# Patient Record
Sex: Female | Born: 1985 | Race: Black or African American | Hispanic: No | Marital: Single | Smoking: Never smoker
Health system: Southern US, Community
[De-identification: ages and names within clinical notes are randomized; demographics above are authoritative.]

## PROBLEM LIST (undated history)

## (undated) DIAGNOSIS — N6009 Solitary cyst of unspecified breast: Secondary | ICD-10-CM

## (undated) DIAGNOSIS — O1213 Gestational proteinuria, third trimester: Secondary | ICD-10-CM

## (undated) HISTORY — PX: CYST EXCISION: SHX5701

## (undated) HISTORY — PX: BREAST CYST EXCISION: SHX579

---
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
## 2007-12-11 ENCOUNTER — Encounter: Admission: RE | Admit: 2007-12-11 | Discharge: 2007-12-11 | Payer: Self-pay | Admitting: Obstetrics and Gynecology

## 2012-07-07 ENCOUNTER — Other Ambulatory Visit: Payer: Self-pay | Admitting: Internal Medicine

## 2012-07-07 DIAGNOSIS — N6019 Diffuse cystic mastopathy of unspecified breast: Secondary | ICD-10-CM

## 2012-11-14 ENCOUNTER — Encounter (HOSPITAL_COMMUNITY): Payer: Self-pay | Admitting: Emergency Medicine

## 2012-11-14 ENCOUNTER — Emergency Department (HOSPITAL_COMMUNITY)
Admission: EM | Admit: 2012-11-14 | Discharge: 2012-11-14 | Disposition: A | Discharge status: Home / Self Care | Payer: BC Managed Care – PPO | Source: Home / Self Care

## 2012-11-14 DIAGNOSIS — L259 Unspecified contact dermatitis, unspecified cause: Secondary | ICD-10-CM

## 2012-11-14 MED ORDER — FLUTICASONE PROPIONATE 0.05 % EX CREA: TOPICAL_CREAM | Freq: Two times a day (BID) | CUTANEOUS | Status: DC

## 2012-11-14 NOTE — ED Provider Notes (Signed)
CSN: [id number]     Arrival date & time 11/14/12  1212 History   None    Chief Complaint  Patient presents with  . Oral Swelling   (Consider location/radiation/quality/duration/timing/severity/associated sxs/prior Treatment) Patient is a 27 y.o. female presenting with rash. The history is provided by the patient.  Rash Pain severity:  No pain Onset quality:  Gradual Duration:  1 day Timing:  Constant Progression:  Unchanged Chronicity:  New Ineffective treatments: benadryl ineffective.   History reviewed. No pertinent past medical history. Past Surgical History  Procedure Laterality Date  . Cyst excision Right     breast   No family history on file. History  Substance Use Topics  . Smoking status: Never Smoker   . Smokeless tobacco: Not on file  . Alcohol Use: Yes   OB History   Grav Para Term Preterm Abortions TAB SAB Ect Mult Living                 Review of Systems  Constitutional: Negative.   Skin: Positive for rash.    Allergies  Peanuts; Penicillins; and Sulfa antibiotics  Home Medications   Current Outpatient Rx  Name  Route  Sig  Dispense  Refill  . diphenhydrAMINE (BENADRYL) 25 mg capsule   Oral   Take 25 mg by mouth every 6 (six) hours as needed for itching.         . fluticasone (CUTIVATE) 0.05 % cream   Topical   Apply topically 2 (two) times daily.   30 g   0    BP 125/57  Pulse 73  Temp(Src) 98.7 F (37.1 C) (Oral)  Resp 16  SpO2 100%  LMP 10/17/2012 Physical Exam  Nursing note and vitals reviewed. Constitutional: She is oriented to person, place, and time. She appears well-developed and well-nourished.  HENT:  Mouth/Throat: Oropharynx is clear and moist.  Cardiovascular: Regular rhythm.   Pulmonary/Chest: Breath sounds normal.  Lymphadenopathy:    She has no cervical adenopathy.  Neurological: She is alert and oriented to person, place, and time.  Skin: Skin is warm and dry. Rash noted.  Upper and lower lip sts and itching  dermatitis.    ED Course  Procedures (including critical care time) Labs Review Labs Reviewed - No data to display Imaging Review No results found.  MDM   1. Acute contact dermatitis       Linna Hoff, MD 11/14/12 [phone]

## 2012-11-14 NOTE — ED Notes (Signed)
Patient reports upper lip swelling that started last night, intermittent.  Reports swelling comes and goes.  Has had benadryl this am.  No tongue swelling, no breathing difficulty

## 2013-07-06 ENCOUNTER — Other Ambulatory Visit: Payer: Self-pay | Admitting: Family Medicine

## 2013-07-06 DIAGNOSIS — N63 Unspecified lump in unspecified breast: Secondary | ICD-10-CM

## 2013-07-07 ENCOUNTER — Other Ambulatory Visit: Payer: Self-pay | Admitting: Internal Medicine

## 2013-07-13 ENCOUNTER — Other Ambulatory Visit: Payer: BC Managed Care – PPO

## 2013-07-13 ENCOUNTER — Inpatient Hospital Stay: Admission: RE | Admit: 2013-07-13 | Payer: BC Managed Care – PPO | Source: Ambulatory Visit

## 2014-01-12 LAB — OB RESULTS CONSOLE HGB/HCT, BLOOD
HEMATOCRIT: 34 %
Hemoglobin: 11.5 g/dL

## 2014-01-12 LAB — OB RESULTS CONSOLE HIV ANTIBODY (ROUTINE TESTING): HIV: NONREACTIVE

## 2014-01-12 LAB — OB RESULTS CONSOLE ABO/RH: ABO/RH(D): AB POS

## 2014-01-12 LAB — OB RESULTS CONSOLE PLATELET COUNT: Platelets: 342 10*3/uL

## 2014-01-12 LAB — OB RESULTS CONSOLE RUBELLA ANTIBODY, IGM: RUBELLA: IMMUNE

## 2014-01-12 LAB — OB RESULTS CONSOLE ANTIBODY SCREEN: Antibody Screen: NEGATIVE

## 2014-01-12 LAB — OB RESULTS CONSOLE HEPATITIS B SURFACE ANTIGEN: Hepatitis B Surface Ag: NEGATIVE

## 2014-01-12 LAB — SICKLE CELL SCREEN: Sickle Cell Screen: NEGATIVE

## 2014-01-12 LAB — OB RESULTS CONSOLE GBS: GBS: POSITIVE

## 2014-01-12 LAB — OB RESULTS CONSOLE RPR: RPR: NONREACTIVE

## 2014-01-19 LAB — OB RESULTS CONSOLE GC/CHLAMYDIA
Chlamydia: NEGATIVE
GC PROBE AMP, GENITAL: NEGATIVE

## 2014-05-19 LAB — OB RESULTS CONSOLE HGB/HCT, BLOOD
HCT: 34 %
HEMOGLOBIN: 11.5 g/dL

## 2014-05-19 LAB — OB RESULTS CONSOLE RPR: RPR: NONREACTIVE

## 2014-05-19 LAB — OB RESULTS CONSOLE PLATELET COUNT: Platelets: 342 10*3/uL

## 2014-07-21 ENCOUNTER — Inpatient Hospital Stay (HOSPITAL_COMMUNITY)
Admission: AD | Admit: 2014-07-21 | Discharge: 2014-07-25 | DRG: 765 | Disposition: A | Discharge status: Home / Self Care | Payer: BC Managed Care – PPO | Source: Ambulatory Visit | Attending: Obstetrics and Gynecology | Admitting: Obstetrics and Gynecology

## 2014-07-21 ENCOUNTER — Encounter (HOSPITAL_COMMUNITY): Payer: Self-pay

## 2014-07-21 DIAGNOSIS — D62 Acute posthemorrhagic anemia: Secondary | ICD-10-CM | POA: Diagnosis not present

## 2014-07-21 DIAGNOSIS — O1490 Unspecified pre-eclampsia, unspecified trimester: Secondary | ICD-10-CM | POA: Diagnosis present

## 2014-07-21 DIAGNOSIS — T368X5A Adverse effect of other systemic antibiotics, initial encounter: Secondary | ICD-10-CM | POA: Diagnosis not present

## 2014-07-21 DIAGNOSIS — Z98891 History of uterine scar from previous surgery: Secondary | ICD-10-CM

## 2014-07-21 DIAGNOSIS — Z3A38 38 weeks gestation of pregnancy: Secondary | ICD-10-CM | POA: Diagnosis present

## 2014-07-21 DIAGNOSIS — Y848 Other medical procedures as the cause of abnormal reaction of the patient, or of later complication, without mention of misadventure at the time of the procedure: Secondary | ICD-10-CM | POA: Diagnosis not present

## 2014-07-21 DIAGNOSIS — O99824 Streptococcus B carrier state complicating childbirth: Secondary | ICD-10-CM | POA: Diagnosis present

## 2014-07-21 DIAGNOSIS — O1213 Gestational proteinuria, third trimester: Secondary | ICD-10-CM

## 2014-07-21 DIAGNOSIS — O1493 Unspecified pre-eclampsia, third trimester: Secondary | ICD-10-CM

## 2014-07-21 DIAGNOSIS — T783XXA Angioneurotic edema, initial encounter: Secondary | ICD-10-CM | POA: Diagnosis not present

## 2014-07-21 DIAGNOSIS — O9081 Anemia of the puerperium: Secondary | ICD-10-CM | POA: Diagnosis not present

## 2014-07-21 DIAGNOSIS — Y92239 Unspecified place in hospital as the place of occurrence of the external cause: Secondary | ICD-10-CM

## 2014-07-21 HISTORY — DX: Gestational proteinuria, third trimester: O12.13

## 2014-07-21 HISTORY — DX: Solitary cyst of unspecified breast: N60.09

## 2014-07-21 LAB — CBC
HCT: 32.9 % — ABNORMAL LOW (ref 36.0–46.0)
HEMATOCRIT: 30.6 % — AB (ref 36.0–46.0)
Hemoglobin: 10.7 g/dL — ABNORMAL LOW (ref 12.0–15.0)
Hemoglobin: 11.4 g/dL — ABNORMAL LOW (ref 12.0–15.0)
MCH: 30.7 pg (ref 26.0–34.0)
MCH: 30.9 pg (ref 26.0–34.0)
MCHC: 34.7 g/dL (ref 30.0–36.0)
MCHC: 35 g/dL (ref 30.0–36.0)
MCV: 88.4 fL (ref 78.0–100.0)
MCV: 88.7 fL (ref 78.0–100.0)
PLATELETS: 303 10*3/uL (ref 150–400)
PLATELETS: 269 10*3/uL (ref 150–400)
RBC: 3.46 MIL/uL — ABNORMAL LOW (ref 3.87–5.11)
RBC: 3.71 MIL/uL — AB (ref 3.87–5.11)
RDW: 13.4 % (ref 11.5–15.5)
RDW: 13.6 % (ref 11.5–15.5)
WBC: 6.6 10*3/uL (ref 4.0–10.5)
WBC: 6.7 10*3/uL (ref 4.0–10.5)

## 2014-07-21 LAB — COMPREHENSIVE METABOLIC PANEL
ALBUMIN: 2.5 g/dL — AB (ref 3.5–5.0)
ALK PHOS: 160 U/L — AB (ref 38–126)
ALT: 69 U/L — ABNORMAL HIGH (ref 14–54)
AST: 42 U/L — AB (ref 15–41)
Anion gap: 7 (ref 5–15)
BILIRUBIN TOTAL: 0.2 mg/dL — AB (ref 0.3–1.2)
BUN: 10 mg/dL (ref 6–20)
CHLORIDE: 108 mmol/L (ref 101–111)
CO2: 21 mmol/L — ABNORMAL LOW (ref 22–32)
Calcium: 9.3 mg/dL (ref 8.9–10.3)
Creatinine, Ser: 0.81 mg/dL (ref 0.44–1.00)
GFR calc Af Amer: >60 mL/min (ref >60)
GFR calc non Af Amer: >60 mL/min (ref >60)
Glucose, Bld: 92 mg/dL (ref 70–99)
POTASSIUM: 3.8 mmol/L (ref 3.5–5.1)
SODIUM: 136 mmol/L (ref 135–145)
TOTAL PROTEIN: 7 g/dL (ref 6.5–8.1)

## 2014-07-21 LAB — PROTEIN / CREATININE RATIO, URINE
Creatinine, Urine: 371 mg/dL
Protein Creatinine Ratio: 0.37 mg/mg{Cre} — ABNORMAL HIGH (ref 0.00–0.15)
Total Protein, Urine: 136 mg/dL

## 2014-07-21 LAB — TYPE AND SCREEN
ABO/RH(D): AB POS
Antibody Screen: NEGATIVE

## 2014-07-21 LAB — ABO/RH: ABO/RH(D): AB POS

## 2014-07-21 LAB — URIC ACID: URIC ACID, SERUM: 5.9 mg/dL (ref 2.3–6.6)

## 2014-07-21 MED ORDER — MAGNESIUM SULFATE 50 % IJ SOLN
2.0000 g/h | INTRAVENOUS | Status: AC
  Administered 2014-07-22 – 2014-07-23 (×2): 2 g/h via INTRAVENOUS
  Filled 2014-07-21 (×3): qty 80

## 2014-07-21 MED ORDER — BUTORPHANOL TARTRATE 1 MG/ML IJ SOLN: 1.0000 mg | INTRAMUSCULAR | Status: DC | PRN

## 2014-07-21 MED ORDER — ACETAMINOPHEN 325 MG PO TABS: 650.0000 mg | ORAL_TABLET | ORAL | Status: DC | PRN

## 2014-07-21 MED ORDER — OXYCODONE-ACETAMINOPHEN 5-325 MG PO TABS: 1.0000 | ORAL_TABLET | ORAL | Status: DC | PRN

## 2014-07-21 MED ORDER — OXYCODONE-ACETAMINOPHEN 5-325 MG PO TABS: 2.0000 | ORAL_TABLET | ORAL | Status: DC | PRN

## 2014-07-21 MED ORDER — MAGNESIUM SULFATE BOLUS VIA INFUSION
4.0000 g | Freq: Once | INTRAVENOUS | Status: AC
  Administered 2014-07-21: 4 g via INTRAVENOUS
  Filled 2014-07-21: qty 500

## 2014-07-21 MED ORDER — OXYTOCIN 40 UNITS IN LACTATED RINGERS INFUSION - SIMPLE MED
1.0000 m[IU]/min | INTRAVENOUS | Status: DC
  Administered 2014-07-21: 2 m[IU]/min via INTRAVENOUS
  Administered 2014-07-21: 10 m[IU]/min via INTRAVENOUS
  Administered 2014-07-22: 16 m[IU]/min via INTRAVENOUS
  Administered 2014-07-22: 20 m[IU]/min via INTRAVENOUS
  Filled 2014-07-21: qty 1000

## 2014-07-21 MED ORDER — OXYTOCIN BOLUS FROM INFUSION
500.0000 mL | INTRAVENOUS | Status: DC
Start: 2014-07-21 — End: 2014-07-22

## 2014-07-21 MED ORDER — DIPHENHYDRAMINE HCL 50 MG/ML IJ SOLN
50.0000 mg | Freq: Once | INTRAMUSCULAR | Status: AC
  Administered 2014-07-21: 50 mg via INTRAVENOUS

## 2014-07-21 MED ORDER — CLINDAMYCIN PHOSPHATE 900 MG/50ML IV SOLN
900.0000 mg | Freq: Three times a day (TID) | INTRAVENOUS | Status: DC
  Administered 2014-07-21: 900 mg via INTRAVENOUS
  Filled 2014-07-21: qty 50

## 2014-07-21 MED ORDER — LIDOCAINE HCL (PF) 1 % IJ SOLN: 30.0000 mL | INTRAMUSCULAR | Status: DC | PRN

## 2014-07-21 MED ORDER — VANCOMYCIN HCL IN DEXTROSE 1-5 GM/200ML-% IV SOLN
1000.0000 mg | Freq: Two times a day (BID) | INTRAVENOUS | Status: DC
  Administered 2014-07-22 (×2): 1000 mg via INTRAVENOUS
  Filled 2014-07-21 (×4): qty 200

## 2014-07-21 MED ORDER — LACTATED RINGERS IV SOLN
INTRAVENOUS | Status: DC
  Administered 2014-07-21 (×2): via INTRAVENOUS
  Administered 2014-07-22: 82 mL/h via INTRAVENOUS
  Administered 2014-07-22: via INTRAVENOUS

## 2014-07-21 MED ORDER — CITRIC ACID-SODIUM CITRATE 334-500 MG/5ML PO SOLN
30.0000 mL | ORAL | Status: DC | PRN
  Administered 2014-07-22: 30 mL via ORAL
  Filled 2014-07-21: qty 15

## 2014-07-21 MED ORDER — LACTATED RINGERS IV SOLN
500.0000 mL | INTRAVENOUS | Status: DC | PRN
  Administered 2014-07-22: 250 mL via INTRAVENOUS

## 2014-07-21 MED ORDER — TERBUTALINE SULFATE 1 MG/ML IJ SOLN: 0.2500 mg | Freq: Once | INTRAMUSCULAR | Status: AC | PRN

## 2014-07-21 MED ORDER — DIPHENHYDRAMINE HCL 50 MG/ML IJ SOLN
INTRAMUSCULAR | Status: AC
  Filled 2014-07-21: qty 1

## 2014-07-21 MED ORDER — OXYTOCIN 40 UNITS IN LACTATED RINGERS INFUSION - SIMPLE MED
62.5000 mL/h | INTRAVENOUS | Status: DC
Start: 2014-07-21 — End: 2014-07-22

## 2014-07-21 NOTE — Progress Notes (Signed)
Here as IV clindamycin and IV magnesium bolus infusion. Watched periorbital edema occurring. Benadryl given. Clindamycin stopped. Will change to IV vancomycin for continued GBS prophylaxis. Pharmacy informed. Pt notified of  New presumed allergy to clindamycin

## 2014-07-21 NOTE — H&P (Signed)
Sherri Lewis is a 29 y.o. female presenting @ 1438 weeks gestation for IOL 2nd to atypical preeclampsia. [facility] >300[facility]  protein on straight cath Maternal Medical History:  Reason for admission: Nausea.  Fetal activity: Perceived fetal activity is normal.    Prenatal complications: no prenatal complications   OB History    Gravida Para Term Preterm AB TAB SAB Ectopic Multiple Living   1         0     Past Medical History  Diagnosis Date  . Breast cyst   . Proteinuria affecting pregnancy in third trimester, antepartum 07/21/2014   Past Surgical History  Procedure Laterality Date  . Cyst excision Right     breast  . Breast cyst excision     Family History: family history includes Cancer in her mother. Social History:  reports that she has never smoked. She does not have any smokeless tobacco history on file. She reports that she does not drink alcohol or use illicit drugs.   Prenatal Transfer Tool  Maternal Diabetes: No Genetic Screening: Normal Maternal Ultrasounds/Referrals: Normal Fetal Ultrasounds or other Referrals:  None Maternal Substance Abuse:  No Significant Maternal Medications:  None Significant Maternal Lab Results:  Lab values include: Group B Strep positive Other Comments:  None  Review of Systems  Eyes: Negative for blurred vision.  Respiratory: Negative for shortness of breath.   Gastrointestinal: Negative for heartburn and nausea.  Neurological: Negative for headaches.  All other systems reviewed and are negative.     Blood pressure 138/77, pulse 67, temperature 98.1 F (36.7 C), temperature source Oral, resp. rate 18, height 5\' 7"  (1.702 m), weight 104.781 kg (231 lb). Maternal Exam:  Uterine Assessment: Contraction strength is mild.  Contraction frequency is irregular.   Abdomen: Patient reports no abdominal tenderness. Fetal presentation: vertex  Introitus: Normal vulva. Ferning test: not done.   Nitrazine test: not done.  Pelvis: adequate for delivery.   Cervix: Cervix evaluated by digital exam.     Fetal Exam Fetal Monitor Review: Variability: moderate (6-25 bpm).   Pattern: accelerations present.    Fetal State Assessment: Category I - tracings are normal.     Physical Exam  Constitutional: She is oriented to person, place, and time. She appears well-developed and well-nourished.  HENT:  Head: Atraumatic.  Eyes: EOM are normal.  Neck: Neck supple.  Cardiovascular: Regular rhythm.   Respiratory: Breath sounds normal.  GI: Soft.  Musculoskeletal: She exhibits edema.  Neurological: She is alert and oriented to person, place, and time.  Skin: Skin is warm and dry.  Psychiatric: She has a normal mood and affect.   VE 2/80/-3/-2 posterior soft  Prenatal labs: ABO, Rh: --/--/AB POSITIVE (11/03 0000) Antibody: Negative (11/03 0000) Rubella: Immune (11/03 0000) RPR: Nonreactive (03/09 0000)  HBsAg: Negative (11/03 0000)  HIV: Non-reactive (11/03 0000)  GBS: Positive (11/03 0000)  CBC    Component Value Date/Time   WBC 6.7 07/21/2014 1630   RBC 3.71* 07/21/2014 1630   HGB 11.4* 07/21/2014 1630   HGB 11.5 05/19/2014   HCT 32.9* 07/21/2014 1630   HCT 34 05/19/2014   PLT 303 07/21/2014 1630   PLT 342 05/19/2014   MCV 88.7 07/21/2014 1630   MCH 30.7 07/21/2014 1630   MCHC 34.7 07/21/2014 1630   RDW 13.4 07/21/2014 1630   CMP Latest Ref Rng 07/21/2014  Glucose 70 - 99 mg/dL 92  BUN 6 - 20 mg/dL 10  Creatinine 0.44 - 1.00 mg/dL 1.300.81  Sodium 865135 - 784145 mmol/L 136  Potassium 3.5 - 5.1 mmol/L 3.8  Chloride 101 - 111 mmol/L 108  CO2 22 - 32 mmol/L 21(L)  Calcium 8.9 - 10.3 mg/dL 9.3  Total Protein 6.5 - 8.1 g/dL 7.0  Total Bilirubin 0.3 - 1.2 mg/dL 6.9(G0.2(L)  Alkaline Phos 38 - 126 U/L 160(H)  AST 15 - 41 U/L 42(H)  ALT 14 - 54 U/L 69(H)     Assessment/Plan: Atypical preeclampsia IUP@ 38 weeks GBS cx (+)  P) admit. Routine labs.repeat PIH labs 6 hrs.  Start pitocin. Clindamycin. Epidural prn. magnesium   Zayden Hahne A 07/21/2014, 6:40 PM

## 2014-07-21 NOTE — Progress Notes (Signed)
Noted pt eyes swelling. Pt reports throat itching and coughing.  Clindamycin almost complete, but discontinued at this time.

## 2014-07-21 NOTE — MAU Provider Note (Signed)
History     CSN: [id number]  Arrival date and time: 07/21/14 1558  Provider notified: 1612 Orders placed in EPIC: 1615 Provider at bedside: 1630     Chief Complaint  Patient presents with  . Foot Swelling   HPI  [name] is 29 yo G1P0 female at 38.[redacted] wks gestation sent from the office with proteinuria (>300 mg on cath urine).  BP WNL in the office. 1+ edema in BLE. Prenatal care complicated by: (+) GBS, S>D and fetal macrosomia. Her primary OB provider is Dr. Cherly Hensenousins at [facility].  Past Medical History  Diagnosis Date  . Breast cyst   . Proteinuria affecting pregnancy in third trimester, antepartum 07/21/2014    Past Surgical History  Procedure Laterality Date  . Cyst excision Right     breast  . Breast cyst excision      Family History  Problem Relation Age of Onset  . Cancer Mother     History  Substance Use Topics  . Smoking status: Never Smoker   . Smokeless tobacco: Not on file  . Alcohol Use: No    Allergies:  Allergies  Allergen Reactions  . Penicillins Anaphylaxis  . Sulfa Antibiotics Anaphylaxis  . Peanuts [Peanut Oil] Swelling    Nut allergy    Prescriptions prior to admission  Medication Sig Dispense Refill Last Dose  . calcium carbonate (TUMS - DOSED IN MG ELEMENTAL CALCIUM) 500 MG chewable tablet Chew 2 tablets by mouth as needed for indigestion or heartburn. Patient take 4-5 times daily.   07/20/2014 at Unknown time  . Prenatal Vit-Fe Fumarate-FA (PRENATAL MULTIVITAMIN) TABS tablet Take 1 tablet by mouth daily at 12 noon.   Past Month at Unknown time  . fluticasone (CUTIVATE) 0.05 % cream Apply topically 2 (two) times daily. (Patient not taking: Reported on 07/21/2014) 30 g 0 Not Taking at Unknown time    Review of Systems  Constitutional: Negative.   HENT: Negative.   Eyes: Negative.   Respiratory: Negative.   Cardiovascular: Positive for leg swelling.       Mild swelling in hands and feet only  Gastrointestinal: Negative.    Genitourinary: Negative.   Musculoskeletal: Negative.   Skin: Negative.   Neurological: Negative.   Endo/Heme/Allergies: Negative.   Psychiatric/Behavioral: Negative.    CEFM FHR: 130 bpm / moderate variability / accels present / no decels TOCO: 2 UC's noted  VE: 1-2 cm / 80% / -2 (done in the office prior to arrival at hospital)  Physical Exam   Blood pressure 136/88, P: 79; 130/90, 68; 129/77, 63; 133/84, 76; 137/119, 72; 144/81; 140/80, 66; 146/128, 79. R: 20; T: not taken  Physical Exam  Constitutional: She is oriented to person, place, and time. She appears well-developed and well-nourished.  HENT:  Head: Normocephalic and atraumatic.  Eyes: Pupils are equal, round, and reactive to light.  Neck: Normal range of motion.  Cardiovascular: Normal rate, regular rhythm, normal heart sounds and intact distal pulses.   1+ non-pitting edema in bilateral feet  Respiratory: Effort normal and breath sounds normal.  GI: Soft.  Hypoactive BS; not eaten since breakfast  Genitourinary:  Pelvic deferred; gravid; S>D  Musculoskeletal: Normal range of motion.  Neurological: She is alert and oriented to person, place, and time. She has normal reflexes.  Skin: Skin is warm and dry.  Psychiatric: She has a normal mood and affect. Her behavior is normal. Judgment and thought content normal.    MAU Course  Procedures CBC CMP Uric Acid  Urine Protein/Creatinine Ratio  CEFM Serial BPs Assessment and Plan  29 yo G1P0 at [redacted] wks gestation Pre-Eclampsia Proteinuria Fetal macorsomia (+) GBS - PCN allergic - no sensitivities done  Admit to L&D for IOL Magnesium Sulfate 4 gram loading dose, then 2 grams per hour See Admission orders / H&P note from Dr. Cherly Hensenousins  *Care assumed by Dr. Cherly Hensenousins at time of admission  Sherri Lewis, ROLITTA, M MSN, CNM 07/21/2014, 4:30 PM

## 2014-07-21 NOTE — MAU Note (Signed)
Patient home MD visit today had protein in her urine [facility]

## 2014-07-22 ENCOUNTER — Inpatient Hospital Stay (HOSPITAL_COMMUNITY): Payer: BC Managed Care – PPO | Admitting: Anesthesiology

## 2014-07-22 ENCOUNTER — Inpatient Hospital Stay (HOSPITAL_COMMUNITY): Payer: BC Managed Care – PPO

## 2014-07-22 ENCOUNTER — Encounter (HOSPITAL_COMMUNITY)
Admission: AD | Disposition: A | Discharge status: Home / Self Care | Payer: Self-pay | Source: Ambulatory Visit | Attending: Obstetrics and Gynecology

## 2014-07-22 ENCOUNTER — Encounter (HOSPITAL_COMMUNITY): Payer: Self-pay | Admitting: Radiology

## 2014-07-22 LAB — COMPREHENSIVE METABOLIC PANEL
ALBUMIN: 2.4 g/dL — AB (ref 3.5–5.0)
ALK PHOS: 171 U/L — AB (ref 38–126)
ALK PHOS: 151 U/L — AB (ref 38–126)
ALT: 62 U/L — ABNORMAL HIGH (ref 14–54)
ALT: 64 U/L — ABNORMAL HIGH (ref 14–54)
ALT: 69 U/L — ABNORMAL HIGH (ref 14–54)
ANION GAP: 7 (ref 5–15)
ANION GAP: 7 (ref 5–15)
AST: 39 U/L (ref 15–41)
AST: 40 U/L (ref 15–41)
AST: 41 U/L (ref 15–41)
Albumin: 2.7 g/dL — ABNORMAL LOW (ref 3.5–5.0)
Albumin: 2.3 g/dL — ABNORMAL LOW (ref 3.5–5.0)
Alkaline Phosphatase: 155 U/L — ABNORMAL HIGH (ref 38–126)
Anion gap: 9 (ref 5–15)
BUN: 7 mg/dL (ref 6–20)
BUN: 6 mg/dL (ref 6–20)
BUN: 9 mg/dL (ref 6–20)
CHLORIDE: 108 mmol/L (ref 101–111)
CHLORIDE: 109 mmol/L (ref 101–111)
CO2: 19 mmol/L — ABNORMAL LOW (ref 22–32)
CO2: 21 mmol/L — ABNORMAL LOW (ref 22–32)
CO2: 21 mmol/L — ABNORMAL LOW (ref 22–32)
CREATININE: 0.78 mg/dL (ref 0.44–1.00)
Calcium: 7.8 mg/dL — ABNORMAL LOW (ref 8.9–10.3)
Calcium: 8.1 mg/dL — ABNORMAL LOW (ref 8.9–10.3)
Calcium: 8.2 mg/dL — ABNORMAL LOW (ref 8.9–10.3)
Chloride: 105 mmol/L (ref 101–111)
Creatinine, Ser: 0.77 mg/dL (ref 0.44–1.00)
Creatinine, Ser: 0.78 mg/dL (ref 0.44–1.00)
GFR calc Af Amer: >60 mL/min (ref >60)
GFR calc Af Amer: >60 mL/min (ref >60)
GFR calc non Af Amer: >60 mL/min (ref >60)
GFR calc non Af Amer: >60 mL/min (ref >60)
GLUCOSE: 111 mg/dL — AB (ref 70–99)
Glucose, Bld: 101 mg/dL — ABNORMAL HIGH (ref 65–99)
Glucose, Bld: 108 mg/dL — ABNORMAL HIGH (ref 65–99)
POTASSIUM: 3.7 mmol/L (ref 3.5–5.1)
Potassium: 3.4 mmol/L — ABNORMAL LOW (ref 3.5–5.1)
Potassium: 3.7 mmol/L (ref 3.5–5.1)
Sodium: 135 mmol/L (ref 135–145)
Sodium: 135 mmol/L (ref 135–145)
Sodium: 136 mmol/L (ref 135–145)
TOTAL PROTEIN: 7.3 g/dL (ref 6.5–8.1)
Total Bilirubin: 0.2 mg/dL — ABNORMAL LOW (ref 0.3–1.2)
Total Bilirubin: 0.3 mg/dL (ref 0.3–1.2)
Total Bilirubin: 0.4 mg/dL (ref 0.3–1.2)
Total Protein: 6.4 g/dL — ABNORMAL LOW (ref 6.5–8.1)
Total Protein: 6.6 g/dL (ref 6.5–8.1)

## 2014-07-22 LAB — CBC
HCT: 32.6 % — ABNORMAL LOW (ref 36.0–46.0)
HEMATOCRIT: 33.9 % — AB (ref 36.0–46.0)
HEMATOCRIT: 34.5 % — AB (ref 36.0–46.0)
HEMOGLOBIN: 11.7 g/dL — AB (ref 12.0–15.0)
HEMOGLOBIN: 11.9 g/dL — AB (ref 12.0–15.0)
Hemoglobin: 11.1 g/dL — ABNORMAL LOW (ref 12.0–15.0)
MCH: 31 pg (ref 26.0–34.0)
MCH: 30.5 pg (ref 26.0–34.0)
MCH: 30.5 pg (ref 26.0–34.0)
MCHC: 34.5 g/dL (ref 30.0–36.0)
MCHC: 34 g/dL (ref 30.0–36.0)
MCHC: 34.5 g/dL (ref 30.0–36.0)
MCV: 89.7 fL (ref 78.0–100.0)
MCV: 89.6 fL (ref 78.0–100.0)
MCV: 88.5 fL (ref 78.0–100.0)
Platelets: 330 10*3/uL (ref 150–400)
Platelets: 288 10*3/uL (ref 150–400)
Platelets: 290 10*3/uL (ref 150–400)
RBC: 3.78 MIL/uL — AB (ref 3.87–5.11)
RBC: 3.64 MIL/uL — ABNORMAL LOW (ref 3.87–5.11)
RBC: 3.9 MIL/uL (ref 3.87–5.11)
RDW: 13.5 % (ref 11.5–15.5)
RDW: 13.6 % (ref 11.5–15.5)
RDW: 13.5 % (ref 11.5–15.5)
WBC: 9 10*3/uL (ref 4.0–10.5)
WBC: 10 10*3/uL (ref 4.0–10.5)
WBC: 15.8 10*3/uL — ABNORMAL HIGH (ref 4.0–10.5)

## 2014-07-22 LAB — MAGNESIUM
Magnesium: 3.8 mg/dL — ABNORMAL HIGH (ref 1.7–2.4)
Magnesium: 4.5 mg/dL — ABNORMAL HIGH (ref 1.7–2.4)

## 2014-07-22 LAB — URIC ACID
URIC ACID, SERUM: 5.5 mg/dL (ref 2.3–6.6)
Uric Acid, Serum: 5.6 mg/dL (ref 2.3–6.6)

## 2014-07-22 LAB — RPR: RPR Ser Ql: NONREACTIVE

## 2014-07-22 SURGERY — Surgical Case
Anesthesia: Epidural

## 2014-07-22 MED ORDER — ONDANSETRON HCL 4 MG/2ML IJ SOLN
INTRAMUSCULAR | Status: DC | PRN
  Administered 2014-07-22: 4 mg via INTRAVENOUS

## 2014-07-22 MED ORDER — LIDOCAINE HCL (PF) 1 % IJ SOLN
INTRAMUSCULAR | Status: DC | PRN
Start: 2014-07-22 — End: 2014-07-22
  Administered 2014-07-22 (×2): 4 mL

## 2014-07-22 MED ORDER — PHENYLEPHRINE 40 MCG/ML (10ML) SYRINGE FOR IV PUSH (FOR BLOOD PRESSURE SUPPORT)
80.0000 ug | PREFILLED_SYRINGE | INTRAVENOUS | Status: DC | PRN
  Filled 2014-07-22: qty 20

## 2014-07-22 MED ORDER — LACTATED RINGERS IV SOLN
INTRAVENOUS | Status: DC | PRN
  Administered 2014-07-22: 21:00:00 via INTRAVENOUS

## 2014-07-22 MED ORDER — MAGNESIUM SULFATE BOLUS VIA INFUSION
2.0000 g | Freq: Once | INTRAVENOUS | Status: AC
  Administered 2014-07-22: 2 g via INTRAVENOUS

## 2014-07-22 MED ORDER — METRONIDAZOLE IN NACL 5-0.79 MG/ML-% IV SOLN
500.0000 mg | Freq: Once | INTRAVENOUS | Status: AC
  Administered 2014-07-22: 500 mg via INTRAVENOUS
  Filled 2014-07-22: qty 100

## 2014-07-22 MED ORDER — SCOPOLAMINE 1 MG/3DAYS TD PT72
MEDICATED_PATCH | TRANSDERMAL | Status: AC
  Filled 2014-07-22: qty 1

## 2014-07-22 MED ORDER — ONDANSETRON HCL 4 MG/2ML IJ SOLN
INTRAMUSCULAR | Status: AC
  Filled 2014-07-22: qty 2

## 2014-07-22 MED ORDER — EPHEDRINE 5 MG/ML INJ: 10.0000 mg | INTRAVENOUS | Status: DC | PRN

## 2014-07-22 MED ORDER — LIDOCAINE-EPINEPHRINE (PF) 2 %-1:200000 IJ SOLN
INTRAMUSCULAR | Status: AC
  Filled 2014-07-22: qty 20

## 2014-07-22 MED ORDER — GENTAMICIN SULFATE 40 MG/ML IJ SOLN
5.0000 mg/kg | Freq: Once | INTRAVENOUS | Status: AC
  Administered 2014-07-22: 390 mg via INTRAVENOUS
  Filled 2014-07-22: qty 9.75

## 2014-07-22 MED ORDER — MORPHINE SULFATE (PF) 0.5 MG/ML IJ SOLN
INTRAMUSCULAR | Status: DC | PRN
  Administered 2014-07-22: 4 mg via EPIDURAL

## 2014-07-22 MED ORDER — HYDROMORPHONE HCL 1 MG/ML IJ SOLN: 0.2500 mg | INTRAMUSCULAR | Status: DC | PRN

## 2014-07-22 MED ORDER — DIPHENHYDRAMINE HCL 50 MG/ML IJ SOLN
INTRAMUSCULAR | Status: AC
  Filled 2014-07-22: qty 1

## 2014-07-22 MED ORDER — PHENYLEPHRINE 40 MCG/ML (10ML) SYRINGE FOR IV PUSH (FOR BLOOD PRESSURE SUPPORT)
PREFILLED_SYRINGE | INTRAVENOUS | Status: AC
  Filled 2014-07-22: qty 10

## 2014-07-22 MED ORDER — SODIUM BICARBONATE 8.4 % IV SOLN
INTRAVENOUS | Status: AC
  Filled 2014-07-22: qty 50

## 2014-07-22 MED ORDER — FENTANYL 2.5 MCG/ML BUPIVACAINE 1/10 % EPIDURAL INFUSION (WH - ANES)
14.0000 mL/h | INTRAMUSCULAR | Status: DC | PRN
  Administered 2014-07-22 (×3): 14 mL/h via EPIDURAL
  Filled 2014-07-22 (×2): qty 125

## 2014-07-22 MED ORDER — MEPERIDINE HCL 25 MG/ML IJ SOLN
6.2500 mg | INTRAMUSCULAR | Status: DC | PRN
Start: 2014-07-22 — End: 2014-07-22

## 2014-07-22 MED ORDER — PHENYLEPHRINE HCL 10 MG/ML IJ SOLN
INTRAMUSCULAR | Status: DC | PRN
  Administered 2014-07-22 (×2): 80 ug via INTRAVENOUS

## 2014-07-22 MED ORDER — METOCLOPRAMIDE HCL 5 MG/ML IJ SOLN
INTRAMUSCULAR | Status: DC | PRN
  Administered 2014-07-22: 10 mg via INTRAVENOUS

## 2014-07-22 MED ORDER — OXYTOCIN 40 UNITS IN LACTATED RINGERS INFUSION - SIMPLE MED: 1.0000 m[IU]/min | INTRAVENOUS | Status: DC

## 2014-07-22 MED ORDER — FENTANYL CITRATE (PF) 250 MCG/5ML IJ SOLN
INTRAMUSCULAR | Status: DC | PRN
  Administered 2014-07-22: 100 ug via INTRAVENOUS

## 2014-07-22 MED ORDER — LIDOCAINE HCL (PF) 1 % IJ SOLN
INTRAMUSCULAR | Status: AC
  Filled 2014-07-22: qty 5

## 2014-07-22 MED ORDER — MORPHINE SULFATE 0.5 MG/ML IJ SOLN
INTRAMUSCULAR | Status: AC
  Filled 2014-07-22: qty 10

## 2014-07-22 MED ORDER — SCOPOLAMINE 1 MG/3DAYS TD PT72
1.0000 | MEDICATED_PATCH | Freq: Once | TRANSDERMAL | Status: DC
  Administered 2014-07-22: 1.5 mg via TRANSDERMAL

## 2014-07-22 MED ORDER — FENTANYL CITRATE (PF) 250 MCG/5ML IJ SOLN
INTRAMUSCULAR | Status: AC
  Filled 2014-07-22: qty 5

## 2014-07-22 MED ORDER — DIPHENHYDRAMINE HCL 50 MG/ML IJ SOLN: 12.5000 mg | INTRAMUSCULAR | Status: DC | PRN

## 2014-07-22 MED ORDER — OXYTOCIN 10 UNIT/ML IJ SOLN
40.0000 [IU] | INTRAMUSCULAR | Status: DC | PRN
  Administered 2014-07-22: 40 [IU] via INTRAVENOUS

## 2014-07-22 MED ORDER — OXYTOCIN 10 UNIT/ML IJ SOLN
INTRAMUSCULAR | Status: AC
  Filled 2014-07-22: qty 4

## 2014-07-22 SURGICAL SUPPLY — 43 items
APL SKNCLS STERI-STRIP NONHPOA (GAUZE/BANDAGES/DRESSINGS)
BARRIER ADHS 3X4 INTERCEED (GAUZE/BANDAGES/DRESSINGS) ×3 IMPLANT
BENZOIN TINCTURE PRP APPL 2/3 (GAUZE/BANDAGES/DRESSINGS) IMPLANT
BRR ADH 4X3 ABS CNTRL BYND (GAUZE/BANDAGES/DRESSINGS) ×1
CLAMP CORD UMBIL (MISCELLANEOUS) IMPLANT
CLOSURE WOUND 1/2 X4 (GAUZE/BANDAGES/DRESSINGS)
CLOTH BEACON ORANGE TIMEOUT ST (SAFETY) ×3 IMPLANT
CONTAINER PREFILL 10% NBF 15ML (MISCELLANEOUS) IMPLANT
DRAPE SHEET LG 3/4 BI-LAMINATE (DRAPES) IMPLANT
DRSG OPSITE POSTOP 4X10 (GAUZE/BANDAGES/DRESSINGS) ×3 IMPLANT
DURAPREP 26ML APPLICATOR (WOUND CARE) ×3 IMPLANT
ELECT REM PT RETURN 9FT ADLT (ELECTROSURGICAL) ×3
ELECTRODE REM PT RTRN 9FT ADLT (ELECTROSURGICAL) ×1 IMPLANT
EXTRACTOR VACUUM M CUP 4 TUBE (SUCTIONS) IMPLANT
EXTRACTOR VACUUM M CUP 4' TUBE (SUCTIONS)
GLOVE BIOGEL PI IND STRL 7.0 (GLOVE) ×1 IMPLANT
GLOVE BIOGEL PI INDICATOR 7.0 (GLOVE) ×2
GLOVE ECLIPSE 6.5 STRL STRAW (GLOVE) ×3 IMPLANT
GOWN STRL REUS W/TWL LRG LVL3 (GOWN DISPOSABLE) ×6 IMPLANT
KIT ABG SYR 3ML LUER SLIP (SYRINGE) IMPLANT
NDL HYPO 25X5/8 SAFETYGLIDE (NEEDLE) IMPLANT
NEEDLE HYPO 22GX1.5 SAFETY (NEEDLE) ×3 IMPLANT
NEEDLE HYPO 25X5/8 SAFETYGLIDE (NEEDLE) IMPLANT
NS IRRIG 1000ML POUR BTL (IV SOLUTION) ×3 IMPLANT
PACK C SECTION WH (CUSTOM PROCEDURE TRAY) ×3 IMPLANT
PAD OB MATERNITY 4.3X12.25 (PERSONAL CARE ITEMS) ×3 IMPLANT
RTRCTR C-SECT PINK 25CM LRG (MISCELLANEOUS) IMPLANT
STAPLER VISISTAT 35W (STAPLE) IMPLANT
STRIP CLOSURE SKIN 1/2X4 (GAUZE/BANDAGES/DRESSINGS) IMPLANT
SUT CHROMIC GUT AB #0 18 (SUTURE) IMPLANT
SUT MNCRL 0 VIOLET CTX 36 (SUTURE) ×3 IMPLANT
SUT MON AB 4-0 PS1 27 (SUTURE) IMPLANT
SUT MONOCRYL 0 CTX 36 (SUTURE) ×6
SUT PLAIN 2 0 (SUTURE)
SUT PLAIN 2 0 XLH (SUTURE) IMPLANT
SUT PLAIN ABS 2-0 CT1 27XMFL (SUTURE) IMPLANT
SUT VIC AB 0 CT1 36 (SUTURE) ×6 IMPLANT
SUT VIC AB 2-0 CT1 27 (SUTURE) ×3
SUT VIC AB 2-0 CT1 TAPERPNT 27 (SUTURE) ×1 IMPLANT
SUT VIC AB 4-0 PS2 27 (SUTURE) IMPLANT
SYR CONTROL 10ML LL (SYRINGE) ×3 IMPLANT
TOWEL OR 17X24 6PK STRL BLUE (TOWEL DISPOSABLE) ×3 IMPLANT
TRAY FOLEY CATH SILVER 14FR (SET/KITS/TRAYS/PACK) IMPLANT

## 2014-07-22 NOTE — Anesthesia Preprocedure Evaluation (Signed)
Anesthesia Evaluation  Patient identified by MRN, date of birth, ID band Patient awake    Reviewed: Allergy & Precautions, NPO status , Patient's Chart, lab work & pertinent test results  History of Anesthesia Complications Negative for: history of anesthetic complications  Airway Mallampati: II  TM Distance: >3 FB Neck ROM: Full    Dental no notable dental hx. (+) Dental Advisory Given   Pulmonary neg pulmonary ROS,  breath sounds clear to auscultation  Pulmonary exam normal       Cardiovascular hypertension (PreE on Mag), Normal cardiovascular examRhythm:Regular Rate:Normal     Neuro/Psych negative neurological ROS  negative psych ROS   GI/Hepatic negative GI ROS, Neg liver ROS,   Endo/Other  obesity  Renal/GU negative Renal ROS  negative genitourinary   Musculoskeletal negative musculoskeletal ROS (+)   Abdominal   Peds negative pediatric ROS (+)  Hematology negative hematology ROS (+)   Anesthesia Other Findings   Reproductive/Obstetrics (+) Pregnancy                             Anesthesia Physical Anesthesia Plan  ASA: III  Anesthesia Plan: Epidural   Post-op Pain Management:    Induction:   Airway Management Planned:   Additional Equipment:   Intra-op Plan:   Post-operative Plan:   Informed Consent: I have reviewed the patients History and Physical, chart, labs and discussed the procedure including the risks, benefits and alternatives for the proposed anesthesia with the patient or authorized representative who has indicated his/her understanding and acceptance.   Dental advisory given  Plan Discussed with: CRNA  Anesthesia Plan Comments:         Anesthesia Quick Evaluation

## 2014-07-22 NOTE — Brief Op Note (Signed)
07/21/2014 - 07/22/2014  10:02 PM  PATIENT:  Sherri Lewis  29 y.o. female  PRE-OPERATIVE DIAGNOSIS:  fetal bradycardia, term gestation, atypical preeclampsia  POST-OPERATIVE DIAGNOSIS:  fetal bradycardia, term gestation, atypical preeclampsia  PROCEDURE:  Emergency primary cesarean section, kerr hysterotomy   SURGEON:  Surgeon(s) and Role:    * Maxie BetterSheronette Jaleea Alesi, MD - Primary  PHYSICIAN ASSISTANT:   ASSISTANTS: Marlinda Mikeanya Bailey, CNM   ANESTHESIA:   spinal Findings: live female LOT presentation, CANx1, nl ovaries, tubes, placenta nl Apgar 8/9 EBL:  Total I/O In: 344 [P.O.:240; I.V.:104] Out: 125 [Urine:125]  BLOOD ADMINISTERED:none  DRAINS: none   LOCAL MEDICATIONS USED:  NONE  SPECIMEN:  Source of Specimen:  placenta  DISPOSITION OF SPECIMEN:  PATHOLOGY  COUNTS:  YES  TOURNIQUET:  * No tourniquets in log *  DICTATION: .Other Dictation: Dictation Number [id number]  PLAN OF CARE: Admit to inpatient   PATIENT DISPOSITION:  PACU - hemodynamically stable.   Delay start of Pharmacological VTE agent (>24hrs) due to surgical blood loss or risk of bleeding: no

## 2014-07-22 NOTE — Progress Notes (Signed)
This note also relates to the following rows which could not be included: Dose (milli-units/min) Oxytocin - Cannot attach notes to extension rows Rate (mL/hr) Oxytocin - Cannot attach notes to extension rows Concentration Oxytocin - Cannot attach notes to extension rows   Antibiotic hung. Pt instructed to notify this nurse of any s/s of allergic reaction.  Pt agreed.

## 2014-07-22 NOTE — Anesthesia Procedure Notes (Signed)
Epidural Patient location during procedure: OB  Staffing Anesthesiologist: Alantis Bethune Performed by: anesthesiologist   Preanesthetic Checklist Completed: patient identified, site marked, surgical consent, pre-op evaluation, timeout performed, IV checked, risks and benefits discussed and monitors and equipment checked  Epidural Patient position: sitting Prep: site prepped and draped and DuraPrep Patient monitoring: continuous pulse ox and blood pressure Approach: midline Location: L3-L4 Injection technique: LOR saline  Needle:  Needle type: Tuohy  Needle gauge: 17 G Needle length: 9 cm and 9 Needle insertion depth: 7 cm Catheter type: closed end flexible Catheter size: 19 Gauge Catheter at skin depth: 12 cm Test dose: negative  Assessment Events: blood not aspirated, injection not painful, no injection resistance, negative IV test and no paresthesia  Additional Notes Patient identified. Risks/Benefits/Options discussed with patient including but not limited to bleeding, infection, nerve damage, paralysis, failed block, incomplete pain control, headache, blood pressure changes, nausea, vomiting, reactions to medication both or allergic, itching and postpartum back pain. Confirmed with bedside nurse the patient's most recent platelet count. Confirmed with patient that they are not currently taking any anticoagulation, have any bleeding history or any family history of bleeding disorders. Patient expressed understanding and wished to proceed. All questions were answered. Sterile technique was used throughout the entire procedure. Please see nursing notes for vital signs. Test dose was given through epidural catheter and negative prior to continuing to dose epidural or start infusion. Warning signs of high block given to the patient including shortness of breath, tingling/numbness in hands, complete motor block, or any concerning symptoms with instructions to call for help. Patient was  given instructions on fall risk and not to get out of bed. All questions and concerns addressed with instructions to call with any issues or inadequate analgesia.      

## 2014-07-22 NOTE — Progress Notes (Signed)
Faculty Practice OB/GYN Attending Note  Patient was noted to have FHR deceleration down to 90s that did not respond to multiple intrauterine neonatal resuscitation maneuvers.  Stat cesarean section was called after 7 minutes, patient to be taken to the OR. Anesthesia, OR and Dr. Cherly Hensenousins aware.     Sherri CollinsUGONNA  Ciji Boston, MD, FACOG Attending Obstetrician & Gynecologist Faculty Practice, [facility]

## 2014-07-22 NOTE — Progress Notes (Signed)
S:  Reviewed events to date Pt with  no complaint  O: Epidural Pitocin 8 miu( restarted due to sub optimal ctx) VE 5/90/0 station sl edematous form 9 to 1 IUPC replaced ISE placed  CBC    Component Value Date/Time   WBC 10.0 07/22/2014 1115   RBC 3.64* 07/22/2014 1115   HGB 11.1* 07/22/2014 1115   HGB 11.5 05/19/2014   HCT 32.6* 07/22/2014 1115   HCT 34 05/19/2014   PLT 288 07/22/2014 1115   PLT 342 05/19/2014   MCV 89.6 07/22/2014 1115   MCH 30.5 07/22/2014 1115   MCHC 34.0 07/22/2014 1115   RDW 13.6 07/22/2014 1115   CMP Latest Ref Rng 07/22/2014 07/22/2014 07/21/2014  Glucose 65 - 99 mg/dL 161(W108(H) 960(A101(H) 540(J111(H)  BUN 6 - 20 mg/dL 6 7 9   Creatinine 0.44 - 1.00 mg/dL 8.110.78 9.140.77 7.820.78  Sodium 135 - 145 mmol/L 135 136 135  Potassium 3.5 - 5.1 mmol/L 3.7 3.7 3.4(L)  Chloride 101 - 111 mmol/L 105 108 109  CO2 22 - 32 mmol/L 21(L) 21(L) 19(L)  Calcium 8.9 - 10.3 mg/dL 7.8(L) 8.1(L) 8.2(L)  Total Protein 6.5 - 8.1 g/dL 6.6 7.3 6.4(L)  Total Bilirubin 0.3 - 1.2 mg/dL 9.5(A0.2(L) 0.4 0.3  Alkaline Phos 38 - 126 U/L 155(H) 171(H) 151(H)  AST 15 - 41 U/L 40 39 41  ALT 14 - 54 U/L 62(H) 69(H) 64(H)    Tracing: baseline 140 mild variability  Ctx q 2 mins  IMP: arrest of dilation due to sub optimal ctx( previously had adequate  Labor but pitocin d/c'ed when fetal tracing  Issues. Pt now has resumed pitocin for poor ctx strength P) right exaggerated sims. Cont pitocin. Disc with pt and family the possibility of C/s for either failed dilation despite adequate labor or fetal tracing is concerning 2) atypical preeclampsia  On magnesium

## 2014-07-22 NOTE — Anesthesia Postprocedure Evaluation (Signed)
  Anesthesia Post-op Note  Patient: Sherri Lewis  Procedure(s) Performed: Procedure(s): CESAREAN SECTION (N/A)  Patient is awake, responsive, moving her legs, and has signs of resolution of her numbness. Pain and nausea are reasonably well controlled. Vital signs are stable and clinically acceptable. Oxygen saturation is clinically acceptable. There are no apparent anesthetic complications at this time. Patient is ready for discharge.

## 2014-07-22 NOTE — Progress Notes (Signed)
Called by secretary that fetal heart rate was down for 5 mins.  Called Sherri Lewis en route to room. Pt already being transported to OR.  Taken over from Chiropractorfaculty practice physician, Dr Macon Largeanyanwu

## 2014-07-22 NOTE — Transfer of Care (Signed)
Immediate Anesthesia Transfer of Care Note  Patient: Sherri Lewis  Procedure(s) Performed: Procedure(s): CESAREAN SECTION (N/A)  Patient Location: PACU  Anesthesia Type:Epidural  Level of Consciousness: awake, alert  and oriented  Airway & Oxygen Therapy: Patient Spontanous Breathing  Post-op Assessment: Report given to RN and Post -op Vital signs reviewed and stable  Post vital signs: Reviewed and stable  Last Vitals:  Filed Vitals:   07/22/14 2030  BP: 139/72  Pulse: 74  Temp:   Resp:     Complications: No apparent anesthesia complications

## 2014-07-22 NOTE — Progress Notes (Signed)
Tachysystolie, repositioned, Pitocin decreased, MD notified.

## 2014-07-22 NOTE — Progress Notes (Signed)
S:  Comfortable with epidural  O:  VS: Blood pressure 133/71, pulse 73, temperature 98 F (36.7 C), temperature source Oral, resp. rate 18, height 5\' 7"  (1.702 m), weight 104.781 kg (231 lb), SpO2 100 %.        FHR : baseline 130 / variability mioderate / accelerations rare / intermittent variable to nadir 110 with few episodes of late decelerations        Toco: contractions every 3-4 minutes / moderate to strong / MVU 200 (no pitocin)        Cervix : 5cm / 90% / vtx -1 with caput        Membranes: clear fluid  A: active labor     FHR category 2  P: monitor closely - repeat CBC /CMP - possible surgical candidate     Dr Cherly Hensenousins updated with status     MD to follow   Marlinda MikeBAILEY, TANYA CNM, MSN, [facility]ACNM 07/22/2014, 9:57 AM

## 2014-07-22 NOTE — Progress Notes (Signed)
S: c/o pain with foley Ready for C/S O: Pitocin Epidural Magnesium VE: 6/edematous/-1/0 caput  Tracing> baseline 125 min variability Ctx q 2- 2 1/2 mins  CBC    Component Value Date/Time   WBC 10.0 07/22/2014 1115   RBC 3.64* 07/22/2014 1115   HGB 11.1* 07/22/2014 1115   HGB 11.5 05/19/2014   HCT 32.6* 07/22/2014 1115   HCT 34 05/19/2014   PLT 288 07/22/2014 1115   PLT 342 05/19/2014   MCV 89.6 07/22/2014 1115   MCH 30.5 07/22/2014 1115   MCHC 34.0 07/22/2014 1115   RDW 13.6 07/22/2014 1115   IMP:  Arrest of dilation Preeclampsia on magnesium P) recommend C/S. Pt agrees unanimously. Risk of C/S reviewed including infection, bleeding, injury to bladder, bowel, ureter, internal scar tissue, option for vaginal C/s in the future, poss need for blood transfusion and its risk( HIV, hepatitis, acute rxn). All ? Answered. Consent signed. OR notified

## 2014-07-22 NOTE — Progress Notes (Signed)
Sherri Lewis is a 29 y.o. G1P0 at 183w1d by ultrasound admitted for induction of labor due to Pre-eclamptic toxemia of pregnancy..  Subjective: Breathing with ctx. Worse pain with lying down  Objective: Magnesium Pitocin 24MIU BP 138/92 mmHg  Pulse 79  Temp(Src) 98 F (36.7 C) (Oral)  Resp 18  Ht 5\' 7"  (1.702 m)  Wt 104.781 kg (231 lb)  BMI 36.17 kg/m2   Total I/O In: 2306.2 [P.O.:1670; I.V.:636.2] Out: 2550 [Urine:2550]  FHT:  FHR: 150 bpm, variability: moderate,  accelerations:  Present,  decelerations:  Present variables UC:   regular, every 2-3  minutes SVE:   4 cm dilated, 70 effaced, -2 station deviated to left AROM clear fluid . IUPC/ISE Labs: Lab Results  Component Value Date   WBC 6.6 07/21/2014   HGB 10.7* 07/21/2014   HCT 30.6* 07/21/2014   MCV 88.4 07/21/2014   PLT 269 07/21/2014    Assessment / Plan: atypical preeclampsia on Magnesium GBS cx (+)  On Vancomycin IUP @ 38 1/7 P) repeat PIH labs. Epidural. Left exaggerated sims position.amnioinfusion prn     Anticipated MOD:  NSVD  Sherri Lewis A 07/22/2014, 5:08 AM

## 2014-07-22 NOTE — Progress Notes (Signed)
FHR tracing continues to be non reassuring. Pt position changed, Pitocin off, Dr. Cherly Hensenousins notified of FHR tracing. Agrees with Pitocin being turned off. Will cont to monitor closely and update MD. Wiliam Ke. Bailey CNM in department and reviewed strip.

## 2014-07-23 ENCOUNTER — Encounter (HOSPITAL_COMMUNITY): Payer: Self-pay

## 2014-07-23 LAB — COMPREHENSIVE METABOLIC PANEL
ALBUMIN: 2 g/dL — AB (ref 3.5–5.0)
ALT: 52 U/L (ref 14–54)
ANION GAP: 9 (ref 5–15)
AST: 40 U/L (ref 15–41)
Alkaline Phosphatase: 145 U/L — ABNORMAL HIGH (ref 38–126)
BUN: 7 mg/dL (ref 6–20)
CALCIUM: 7.3 mg/dL — AB (ref 8.9–10.3)
CO2: 21 mmol/L — AB (ref 22–32)
Chloride: 105 mmol/L (ref 101–111)
Creatinine, Ser: 1 mg/dL (ref 0.44–1.00)
GFR calc Af Amer: >60 mL/min (ref >60)
GFR calc non Af Amer: >60 mL/min (ref >60)
Glucose, Bld: 114 mg/dL — ABNORMAL HIGH (ref 65–99)
Potassium: 4 mmol/L (ref 3.5–5.1)
Sodium: 135 mmol/L (ref 135–145)
Total Bilirubin: 0.4 mg/dL (ref 0.3–1.2)
Total Protein: 5.8 g/dL — ABNORMAL LOW (ref 6.5–8.1)

## 2014-07-23 LAB — CBC
HEMATOCRIT: 30.6 % — AB (ref 36.0–46.0)
Hemoglobin: 10.4 g/dL — ABNORMAL LOW (ref 12.0–15.0)
MCH: 30.1 pg (ref 26.0–34.0)
MCHC: 34 g/dL (ref 30.0–36.0)
MCV: 88.7 fL (ref 78.0–100.0)
Platelets: 272 10*3/uL (ref 150–400)
RBC: 3.45 MIL/uL — ABNORMAL LOW (ref 3.87–5.11)
RDW: 13.6 % (ref 11.5–15.5)
WBC: 14 10*3/uL — ABNORMAL HIGH (ref 4.0–10.5)

## 2014-07-23 LAB — MAGNESIUM: Magnesium: 5.8 mg/dL — ABNORMAL HIGH (ref 1.7–2.4)

## 2014-07-23 MED ORDER — SIMETHICONE 80 MG PO CHEW: 80.0000 mg | CHEWABLE_TABLET | ORAL | Status: DC | PRN

## 2014-07-23 MED ORDER — METRONIDAZOLE IN NACL 5-0.79 MG/ML-% IV SOLN: 500.0000 mg | Freq: Three times a day (TID) | INTRAVENOUS | Status: DC

## 2014-07-23 MED ORDER — LANOLIN HYDROUS EX OINT: 1.0000 "application " | TOPICAL_OINTMENT | CUTANEOUS | Status: DC | PRN

## 2014-07-23 MED ORDER — NALOXONE HCL 0.4 MG/ML IJ SOLN
0.4000 mg | INTRAMUSCULAR | Status: DC | PRN
Start: 2014-07-23 — End: 2014-07-25

## 2014-07-23 MED ORDER — ONDANSETRON HCL 4 MG/2ML IJ SOLN: 4.0000 mg | Freq: Three times a day (TID) | INTRAMUSCULAR | Status: DC | PRN

## 2014-07-23 MED ORDER — LACTATED RINGERS IV SOLN
INTRAVENOUS | Status: AC
  Administered 2014-07-23: 06:00:00 via INTRAVENOUS

## 2014-07-23 MED ORDER — SENNOSIDES-DOCUSATE SODIUM 8.6-50 MG PO TABS
2.0000 | ORAL_TABLET | ORAL | Status: DC
  Administered 2014-07-23 (×2): 2 via ORAL
  Filled 2014-07-23 (×2): qty 2

## 2014-07-23 MED ORDER — NALOXONE HCL 1 MG/ML IJ SOLN
1.0000 ug/kg/h | INTRAVENOUS | Status: DC | PRN
  Filled 2014-07-23: qty 2

## 2014-07-23 MED ORDER — DIPHENHYDRAMINE HCL 50 MG/ML IJ SOLN: 12.5000 mg | INTRAMUSCULAR | Status: DC | PRN

## 2014-07-23 MED ORDER — OXYCODONE-ACETAMINOPHEN 5-325 MG PO TABS
1.0000 | ORAL_TABLET | ORAL | Status: DC | PRN
  Administered 2014-07-24 – 2014-07-25 (×4): 1 via ORAL
  Filled 2014-07-23 (×4): qty 1

## 2014-07-23 MED ORDER — FLEET ENEMA 7-19 GM/118ML RE ENEM: 1.0000 | ENEMA | Freq: Every day | RECTAL | Status: DC | PRN

## 2014-07-23 MED ORDER — IBUPROFEN 600 MG PO TABS
600.0000 mg | ORAL_TABLET | Freq: Four times a day (QID) | ORAL | Status: DC
  Administered 2014-07-23 – 2014-07-25 (×11): 600 mg via ORAL
  Filled 2014-07-23 (×11): qty 1

## 2014-07-23 MED ORDER — NALBUPHINE HCL 10 MG/ML IJ SOLN: 5.0000 mg | INTRAMUSCULAR | Status: DC | PRN

## 2014-07-23 MED ORDER — FERROUS SULFATE 325 (65 FE) MG PO TABS
325.0000 mg | ORAL_TABLET | Freq: Two times a day (BID) | ORAL | Status: DC
  Administered 2014-07-23 – 2014-07-25 (×5): 325 mg via ORAL
  Filled 2014-07-23 (×5): qty 1

## 2014-07-23 MED ORDER — DIPHENHYDRAMINE HCL 25 MG PO CAPS: 25.0000 mg | ORAL_CAPSULE | ORAL | Status: DC | PRN

## 2014-07-23 MED ORDER — SODIUM CHLORIDE 0.9 % IJ SOLN: 3.0000 mL | INTRAMUSCULAR | Status: DC | PRN

## 2014-07-23 MED ORDER — OXYTOCIN 40 UNITS IN LACTATED RINGERS INFUSION - SIMPLE MED: 62.5000 mL/h | INTRAVENOUS | Status: AC

## 2014-07-23 MED ORDER — NALBUPHINE HCL 10 MG/ML IJ SOLN: 5.0000 mg | Freq: Once | INTRAMUSCULAR | Status: AC | PRN

## 2014-07-23 MED ORDER — ACETAMINOPHEN 500 MG PO TABS
1000.0000 mg | ORAL_TABLET | Freq: Four times a day (QID) | ORAL | Status: AC
  Administered 2014-07-23 (×4): 1000 mg via ORAL
  Filled 2014-07-23 (×4): qty 2

## 2014-07-23 MED ORDER — BISACODYL 10 MG RE SUPP: 10.0000 mg | Freq: Every day | RECTAL | Status: DC | PRN

## 2014-07-23 MED ORDER — ZOLPIDEM TARTRATE 5 MG PO TABS: 5.0000 mg | ORAL_TABLET | Freq: Every evening | ORAL | Status: DC | PRN

## 2014-07-23 MED ORDER — METRONIDAZOLE IN NACL 5-0.79 MG/ML-% IV SOLN
500.0000 mg | Freq: Once | INTRAVENOUS | Status: AC
  Administered 2014-07-23: 500 mg via INTRAVENOUS
  Filled 2014-07-23: qty 100

## 2014-07-23 MED ORDER — SODIUM CHLORIDE 0.9 % IJ SOLN
3.0000 mL | INTRAMUSCULAR | Status: DC | PRN
  Administered 2014-07-24: 3 mL via INTRAVENOUS
  Filled 2014-07-23: qty 3

## 2014-07-23 MED ORDER — SODIUM CHLORIDE 0.9 % IV SOLN: 250.0000 mL | INTRAVENOUS | Status: DC

## 2014-07-23 MED ORDER — DIBUCAINE 1 % RE OINT: 1.0000 "application " | TOPICAL_OINTMENT | RECTAL | Status: DC | PRN

## 2014-07-23 MED ORDER — SODIUM CHLORIDE 0.9 % IJ SOLN: 3.0000 mL | Freq: Two times a day (BID) | INTRAMUSCULAR | Status: DC

## 2014-07-23 MED ORDER — WITCH HAZEL-GLYCERIN EX PADS: 1.0000 "application " | MEDICATED_PAD | CUTANEOUS | Status: DC | PRN

## 2014-07-23 MED ORDER — OXYCODONE-ACETAMINOPHEN 5-325 MG PO TABS
2.0000 | ORAL_TABLET | ORAL | Status: DC | PRN
  Administered 2014-07-24 (×2): 2 via ORAL
  Filled 2014-07-23 (×2): qty 2

## 2014-07-23 MED ORDER — MENTHOL 3 MG MT LOZG: 1.0000 | LOZENGE | OROMUCOSAL | Status: DC | PRN

## 2014-07-23 MED ORDER — SIMETHICONE 80 MG PO CHEW
80.0000 mg | CHEWABLE_TABLET | ORAL | Status: DC
  Administered 2014-07-23 – 2014-07-25 (×3): 80 mg via ORAL
  Filled 2014-07-23 (×3): qty 1

## 2014-07-23 MED ORDER — ACETAMINOPHEN 325 MG PO TABS
650.0000 mg | ORAL_TABLET | ORAL | Status: DC | PRN
  Administered 2014-07-24: 650 mg via ORAL
  Filled 2014-07-23: qty 2

## 2014-07-23 MED ORDER — DIPHENHYDRAMINE HCL 25 MG PO CAPS: 25.0000 mg | ORAL_CAPSULE | Freq: Four times a day (QID) | ORAL | Status: DC | PRN

## 2014-07-23 MED ORDER — SIMETHICONE 80 MG PO CHEW
80.0000 mg | CHEWABLE_TABLET | Freq: Three times a day (TID) | ORAL | Status: DC
  Administered 2014-07-23 – 2014-07-25 (×8): 80 mg via ORAL
  Filled 2014-07-23 (×8): qty 1

## 2014-07-23 MED ORDER — PRENATAL MULTIVITAMIN CH
1.0000 | ORAL_TABLET | Freq: Every day | ORAL | Status: DC
  Administered 2014-07-23 – 2014-07-25 (×3): 1 via ORAL
  Filled 2014-07-23 (×3): qty 1

## 2014-07-23 NOTE — Addendum Note (Signed)
Addendum  created 07/23/14 0753 by Renford DillsJanet L Layci Stenglein, CRNA   Modules edited: Notes Section   Notes Section:  File: [id number]

## 2014-07-23 NOTE — Lactation Note (Addendum)
This note was copied from the chart of Sherri Lewis Brees. Lactation Consultation Note  Patient Name: Sherri Lewis Diop ZOXWR'UToday's Date: 07/23/2014 Reason for consult: Initial assessment    with this mom of a term baby, now 6420 hours old. Mom is in AICU. The baby has been breast feeding well. Mom has large nipples, but with a good latch, the baby can handle mom's nipple, and latch deeply with consistent visible swalows. Mom has lots of easily expressed colostrum. Baby voiding and passing stools well. Teaching done from the baby and me book, on breast feeding. Mom knows to call for questions/concerns.    Maternal Data Formula Feeding for Exclusion: No Has patient been taught Hand Expression?: Yes  Feeding Feeding Type: Breast Fed Length of feed: 20 min (rythmic sucking about 12 minutes)  LATCH Score/Interventions Latch: Repeated attempts needed to sustain latch, nipple held in mouth throughout feeding, stimulation needed to elicit sucking reflex. (mom has large nipple - mom shown how to wait for wide mouth and use asymettrical latch) Intervention(s): Adjust position;Assist with latch;Breast compression  Audible Swallowing: Spontaneous and intermittent  Type of Nipple: Everted at rest and after stimulation  Comfort (Breast/Nipple): Soft / non-tender     Hold (Positioning): Assistance needed to correctly position infant at breast and maintain latch. Intervention(s): Breastfeeding basics reviewed;Support Pillows;Position options;Skin to skin  LATCH Score: 8  Lactation Tools Discussed/Used     Consult Status Consult Status: Follow-up Date: 07/24/14 Follow-up type: In-patient    Alfred LevinsLee, Xochil Shanker Anne 07/23/2014, 6:04 PM

## 2014-07-23 NOTE — Progress Notes (Signed)
UR chart review completed.  

## 2014-07-23 NOTE — Op Note (Signed)
NAMShirley Muscat:  Fencl, Kirat                ACCOUNT NO.:  [id number]  MEDICAL RECORD NO.:  [id number]  LOCATION:  9372                          FACILITY:  [facility]  PHYSICIAN:  Maxie BetterSheronette Dalary Hollar, M.D.DATE OF BIRTH:  Nov 13, 1985  DATE OF PROCEDURE:  07/22/2014 DATE OF DISCHARGE:                              OPERATIVE REPORT   PREOPERATIVE DIAGNOSES:  Fetal bradycardia, atypical preeclampsia, term gestation.  PROCEDURE:  Emergency primary cesarean section, Kerr hysterotomy.  POSTOPERATIVE DIAGNOSES:  Fetal bradycardia, term gestation,atypical preeclampsia.  ANESTHESIA:  Epidural.  SURGEON:  Maxie BetterSheronette Roderick Sweezy, M.D.  ASSISTANT:  Marlinda Mikeanya Bailey, CNM  DESCRIPTION OF PROCEDURE:  Under rapid epidural anesthesia, the patient was placed in the supine position.  Indwelling Foley catheter was already in place draining blood-tinged urine.  The patient was sterilely quickly prepped, draped confirming anesthesia level.  Pfannenstiel skin incision was then quickly made, carried down to the rectus fascia. Rectus fascia opened transversely.  The rectus fascia was then bluntly and sharply dissected off the rectus muscles superiorly and inferiorly. The rectus muscle was split in the midline.  The parietal peritoneum was entered bluntly.  Bladder retractor was then placed.  Curvilinear low transverse uterine incision was then made, after the vesicouterine peritoneum was opened.  The incision was opened with scissors and extended.  Copious clear amniotic fluid was noted.  Subsequent delivery of a live female from the left occiput transverse position with a cord around the neck which was loose that was reduced.  Baby was bulb suctioned in the abdomen.  Cord was clamped and cut.  The baby was transferred to the awaiting pediatrician whose assignment of Apgars were 8 and 9 at one and five minutes respectively.  The placenta which was posterior was spontaneous intact and sent to Pathology.  Uterine cavity was cleaned  of debris.  Uterine incision had no extension, was closed in 2 layers, the first layer with 0 Monocryl running locked stitch, second layer was imbricated using 0 Monocryl suture.  Normal tubes and ovaries were noted bilaterally.  Superficial small pea-sized fibroid was noted posteriorly.  Abdomen was irrigated and suctioned.  Interceed was placed in the lower uterine segment in an inverted T fashion.  Parietal peritoneum was then closed with 2-0 Vicryl.  Rectus fascia was closed with 0 Vicryl x2.  The subcutaneous area was irrigated, small bleeders cauterized.  Interrupted 2-0 plain sutures placed and the skin approximated with Ethicon staples.  SPECIMEN:  Placenta sent to Pathology.  Cord pH pending.  ESTIMATED BLOOD LOSS:  600 mL.  INTRAOPERATIVE FLUID:  About 400 mL.  URINE OUTPUT:  125 mL.  Sponge and instrument count ultimately was correct after x-ray was performed.  COMPLICATION:  None.  DISPOSITION:  The patient tolerated the procedure well, was transferred to recovery room in stable condition. Baby was placed skin to skin.     Maxie BetterSheronette Shawnita Krizek, M.D.     Bowman/MEDQ  D:  07/22/2014  T:  07/23/2014  Job:  [id number]

## 2014-07-23 NOTE — Progress Notes (Addendum)
POD # 1  Subjective: Pt reports feeling ok/ Pain controlled with Motrin and long acting spinal narcotic Tolerating po-only had crackers and fluids/ Foley in place/ Voiding without problems/ No n/v/ Flatus absent HA earlier, none now +dizziness, +visual spots at times Activity: BR Bleeding is light Newborn info:  Information for the patient's newborn:  Jolaine ArtistDixon, Boy Walsie [[id number]][[id number]]  female   Circumcision: planning/ Feeding: breast   Objective:  VS:  Filed Vitals:   07/23/14 0700 07/23/14 0757 07/23/14 0800 07/23/14 0900  BP: 136/70  130/72 132/73  Pulse: 66 72 75 66  Temp:   98.5 F (36.9 C)   TempSrc:   Oral   Resp:  20 18   Height:      Weight:      SpO2: 100% 95% 95% 100%     I&O: Intake/Output      05/12 0701 - 05/13 0700 05/13 0701 - 05/14 0700   P.O. 4040 200   I.V. (mL/kg) 3079.5 (29.2) 325 (3.1)   IV Piggyback 400    Total Intake(mL/kg) 7519.5 (71.3) 525 (5)   Urine (mL/kg/hr) 4755 (1.9) 210 (0.9)   Emesis/NG output 300 (0.1)    Blood 600 (0.2)    Total Output 5655 210   Net +1864.5 +315           Recent Labs  07/22/14 2315 07/23/14 0541  WBC 15.8* 14.0*  HGB 11.9* 10.4*  HCT 34.5* 30.6*  PLT 290 272    Blood type: --/--/AB POS (05/11 1815) Rubella: Immune (11/03 0000)    Physical Exam:  General: alert and cooperative CV: Regular rate and rhythm Resp: CTA bilaterally Abdomen: soft, nontender, normal bowel sounds Incision: healing well, no drainage, no erythema, no hernia, no seroma, no swelling, well approximated with staples, honeycomb dsg c/d/i Uterine Fundus: firm, below umbilicus, nontender Lochia: minimal Ext: edema 1+ BLE and Homans sign is negative, no sign of DVT, DTRs 1+, no clonus GU: Foley to SD, clear, light yellow, abundant  Assessment: POD # 1/ G1P1001/ S/P C/Section d/t fetal bradycardia, PEC  Preeclampsia, delivered ABL anemia Doing well  Plan: LE trending down Continue MgSO4 x24 hrs-2100 tonight Advance diet as  tolerated Strict I/O Ambulate w/assist Continue routine post op orders Dizziness and visual changes likely d/t Mag Sulfate-expect resolution after d/c  Dr. Cherly Hensenousins updated with A/P, agrees   Signed: Donette LarryBHAMBRI, MELANIE, Dorris CarnesN, MSN, CNM 07/23/2014, 9:11 AM  Addendum. Pt seen. Agree with above. D/c magnesium sulfate at 9 pm and transfer out of AICU immed thereafter based on clinical presentation and continued nl BP

## 2014-07-23 NOTE — Progress Notes (Signed)
Pt states that she has been awake for the past 48 hours, in labor and taking care of baby. She reported feeling very dizzy, as if the room were spinning. Denies N/V. Blood glucose resulted WNL, BP has been running 130s/70s. Pulse WNL. Encouraged pt to rest; hand expressed milk to send to central nursery with baby so pt could try to get undisrupted sleep. Wrote central nursery's number on board and told support person to call if they wanted an update or baby to come back up.   07/23/2014 7:07 AM

## 2014-07-23 NOTE — Anesthesia Postprocedure Evaluation (Signed)
  Anesthesia Post-op Note  Patient: Sherri Lewis  Procedure(s) Performed: Procedure(s): CESAREAN SECTION (N/A)  Patient Location: A-ICU  Anesthesia Type:Epidural  Level of Consciousness: awake  Airway and Oxygen Therapy: Patient Spontanous Breathing  Post-op Pain: mild  Post-op Assessment: Patient's Cardiovascular Status Stable and Respiratory Function Stable  Post-op Vital Signs: stable  Last Vitals:  Filed Vitals:   07/23/14 0700  BP: 136/70  Pulse: 66  Temp:   Resp:     Complications: No apparent anesthesia complications

## 2014-07-24 DIAGNOSIS — D62 Acute posthemorrhagic anemia: Secondary | ICD-10-CM | POA: Diagnosis not present

## 2014-07-24 LAB — CBC
HCT: 26.8 % — ABNORMAL LOW (ref 36.0–46.0)
Hemoglobin: 9.1 g/dL — ABNORMAL LOW (ref 12.0–15.0)
MCH: 30.6 pg (ref 26.0–34.0)
MCHC: 34 g/dL (ref 30.0–36.0)
MCV: 90.2 fL (ref 78.0–100.0)
Platelets: 244 10*3/uL (ref 150–400)
RBC: 2.97 MIL/uL — ABNORMAL LOW (ref 3.87–5.11)
RDW: 14 % (ref 11.5–15.5)
WBC: 11 10*3/uL — AB (ref 4.0–10.5)

## 2014-07-24 LAB — COMPREHENSIVE METABOLIC PANEL
ALBUMIN: 1.8 g/dL — AB (ref 3.5–5.0)
ALK PHOS: 128 U/L — AB (ref 38–126)
ALT: 37 U/L (ref 14–54)
AST: 24 U/L (ref 15–41)
Anion gap: 8 (ref 5–15)
BUN: 9 mg/dL (ref 6–20)
CO2: 23 mmol/L (ref 22–32)
CREATININE: 0.99 mg/dL (ref 0.44–1.00)
Calcium: 7.9 mg/dL — ABNORMAL LOW (ref 8.9–10.3)
Chloride: 106 mmol/L (ref 101–111)
GFR calc non Af Amer: >60 mL/min (ref >60)
Glucose, Bld: 92 mg/dL (ref 65–99)
POTASSIUM: 3.6 mmol/L (ref 3.5–5.1)
Sodium: 137 mmol/L (ref 135–145)
Total Bilirubin: 0.2 mg/dL — ABNORMAL LOW (ref 0.3–1.2)
Total Protein: 5 g/dL — ABNORMAL LOW (ref 6.5–8.1)

## 2014-07-24 MED ORDER — MAGNESIUM OXIDE 400 (241.3 MG) MG PO TABS
200.0000 mg | ORAL_TABLET | Freq: Every day | ORAL | Status: DC
Start: 2014-07-24 — End: 2014-07-25
  Administered 2014-07-24 – 2014-07-25 (×2): 200 mg via ORAL
  Filled 2014-07-24 (×3): qty 0.5

## 2014-07-24 NOTE — Progress Notes (Signed)
POD # 2  Subjective: Pt reports feeling well-much better since off MgSO4/ Pain controlled with Motrin and Percocet Tolerating po/Voiding without problems/ No n/v/ Flatus absent No HA, visual disturbances, or epigastric pain Activity: ad lib Bleeding is light Newborn info:  Information for the patient's newborn:  Jolaine ArtistDixon, Boy Colbie [[id number]][[id number]]  female   Circumcision: done/ Feeding: breast   Objective: VS:  Filed Vitals:   07/23/14 2207 07/23/14 2300 07/24/14 0305 07/24/14 0602  BP: 130/71 136/57 136/69 131/66  Pulse: 76 69 65 59  Temp:  98.4 F (36.9 C) 98.8 F (37.1 C) 98 F (36.7 C)  TempSrc:  Oral Oral Oral  Resp: 17 18 18 18   Height:      Weight:      SpO2: 100%       I&O: Intake/Output      05/13 0701 - 05/14 0700 05/14 0701 - 05/15 0700   P.O. 1820    I.V. (mL/kg) 1475 (14)    IV Piggyback     Total Intake(mL/kg) 3295 (31.2)    Urine (mL/kg/hr) 4990 (2)    Emesis/NG output     Blood     Total Output 4990     Net -1695          Urine Occurrence 1 x      LABS:  Recent Labs  07/23/14 0541 07/24/14 0641  WBC 14.0* 11.0*  HGB 10.4* 9.1*  HCT 30.6* 26.8*  PLT 272 244    Blood type: --/--/AB POS (05/11 1815) Rubella: Immune (11/03 0000)     Physical Exam:  General: alert and cooperative CV: Regular rate and rhythm Resp: CTA bilaterally Abdomen: soft, nontender, normal bowel sounds Uterine Fundus: firm, below umbilicus, nontender Incision: Covered with Tegaderm and honeycomb dressing; no significant drainage, edema, bruising, or erythema; well approximated with staples Lochia: minimal Ext: edema trace BLE and Homans sign is negative, no sign of DVT    Assessment/: POD # 2/ G1P1001/ S/P C/Section d/t fetal bradycardia, PEC  Preeclampsia, delivered-Mag Sulfate d/c'd 12 hrs ago ABL anemia Doing well  Plan: LE trending down, BPs stable, good diuresis Ambulate TID Warm liquids to facilitate flatus Continue routine post op orders Strict I/O  until discharge Anticipate discharge home tomorrow, blood pressure check in 1 wk at [facility]  Signed: Donette LarryBHAMBRI, Tarun Patchell, N, MSN, CNM 07/24/2014, 9:24 AM

## 2014-07-25 MED ORDER — IBUPROFEN 600 MG PO TABS: 600.0000 mg | ORAL_TABLET | Freq: Four times a day (QID) | ORAL | Status: DC

## 2014-07-25 MED ORDER — OXYCODONE-ACETAMINOPHEN 5-325 MG PO TABS: 1.0000 | ORAL_TABLET | ORAL | Status: DC | PRN

## 2014-07-25 MED ORDER — FERROUS SULFATE 325 (65 FE) MG PO TABS: 325.0000 mg | ORAL_TABLET | Freq: Every day | ORAL | Status: DC

## 2014-07-25 MED ORDER — MAGNESIUM OXIDE 400 (241.3 MG) MG PO TABS: 400.0000 mg | ORAL_TABLET | Freq: Every day | ORAL | Status: DC

## 2014-07-25 NOTE — Lactation Note (Signed)
This note was copied from the chart of Sherri Shirley MuscatMcNair Stalker. Lactation Consultation Note  Patient Name: Sherri Lewis ZOXWR'UToday's Date: 07/25/2014 Reason for consult: Follow-up assessment Mom reports being tired from baby cluster feeding. Mom reports some mild nipple tenderness, advised to apply EBM and Mom has comfort gels. Mom reports baby will BF on 1 breast for 30-45 minutes. LC advised Mom to try and BF both breasts each feeding to help baby get more volume at the breast and be more satisfied. Mom reports with most feedings baby is only BF on 1 breast. Mom has DEBP and with pumping/hand expression is receiving approx 5 ml of colostrum. Encouraged to continue to finger feed this back to baby with feedings. Advised Mom she could pump 4 or more times per day for 15 minutes to encourage milk production and give the EBM back to baby as supplement to help with cluster feeding. Baby at 8% weight loss but on 2.5% weight loss in the past 24 hours, adequate void/stools. Swallows noted with baby nursing at this visit. LC assisted Mom to obtain more depth with latch for better milk transfer. Engorgement care reviewed if needed. Advised of OP services and support group. Encouraged to call for questions/concerns.   Maternal Data    Feeding Feeding Type: Breast Fed Length of feed: 25 min  LATCH Score/Interventions Latch: Grasps breast easily, tongue down, lips flanged, rhythmical sucking. Intervention(s): Adjust position;Assist with latch;Breast massage;Breast compression  Audible Swallowing: A few with stimulation  Type of Nipple: Everted at rest and after stimulation  Comfort (Breast/Nipple): Filling, red/small blisters or bruises, mild/mod discomfort  Problem noted: Mild/Moderate discomfort  Hold (Positioning): Assistance needed to correctly position infant at breast and maintain latch. Intervention(s): Breastfeeding basics reviewed;Support Pillows;Position options;Skin to skin  LATCH Score:  7  Lactation Tools Discussed/Used Tools: Comfort gels   Consult Status Consult Status: Complete Date: 07/25/14 Follow-up type: In-patient    Sherri Lewis, Sherri Lewis 07/25/2014, 9:52 AM

## 2014-07-25 NOTE — Progress Notes (Signed)
POSTOPERATIVE DAY # 3 S/P CS - urgent for fetal bradycardia / pre-eclampsia  S:        Reports feeling ok - tired from no sleep             No PIH symtoms             Tolerating po intake / no nausea / no vomiting / + flatus / no BM             Bleeding is light             Pain controlled with motrin and percocet             Up ad lib / ambulatory/ voiding QS  Newborn breast feeding  / Circumcision done  O:  VS: BP 146/79 mmHg  Pulse 77  Temp(Src) 99 F (37.2 C) (Oral)  Resp 20  Ht 5\' 7"  (1.702 m)  Wt 105.461 kg (232 lb 8 oz)  BMI 36.41 kg/m2  SpO2 100%  Breastfeeding? Unknown              BP: 146/70 - 151/69 - 138/72 - 132/80 - 143/77 - 131/66 - 136/69  LABS:               Recent Labs  07/23/14 0541 07/24/14 0641  WBC 14.0* 11.0*  HGB 10.4* 9.1*  PLT 272 244               Bloodtype: --/--/AB POS (05/11 1815)  Rubella: Immune (11/03 0000)                                            I&O: + 754             Physical Exam:             Alert and Oriented X3  Lungs: Clear and unlabored  Heart: regular rate and rhythm / no mumurs  Abdomen: soft, non-tender, non-distended, active BS             Fundus: firm, non-tender, Ueven             Dressing intact honeycomb             Incision:  approximated with staples / no erythema / no ecchymosis / no drainage             Lochia: scant  Extremities: 1+ dependent edema / 2+ pedal, no calf pain or tenderness, negative Homans  A:        POD # 3 S/P CS (Urgent - fetal bradycardia)            Pre-eclampsia - delivered / stable BP and labs - no agent for control            ABL anemia - stable hemodynamically / asymptomatic  P:        Routine postoperative care - DC home             PIH precautions             Staple removal and BP recheck in office this week             Iron x 4 weeks with magnesium oral for constipation prevention              Marlinda MikeBAILEY, Paschal Blanton CNM, MSN, [facility] 07/25/2014, 10:15 AM

## 2014-07-25 NOTE — Discharge Summary (Signed)
POSTOPERATIVE DISCHARGE SUMMARY:  Patient ID: Sherri Lewis MRN: [id number] DOB/AGE: Dec 05, 1985 29 y.o.  Admit date: 07/21/2014 Admission Diagnoses: 38.[redacted] weeks pregnant / atypical pre-eclampsia / proteinuria  Discharge date: 07/25/2014  Discharge Diagnoses:POD 3 s/p cesarean section - urgent for bradycardia / pre-eclampsia - delivered and resolving / acute blood loss anemia  Prenatal history: G1P1001   EDC : 08/04/2014, by Other Basis  [facility]  Primary provider : Dr Cherly Hensenousins Prenatal course complicated by excessive weight gain / constipation / uterine fibroid / + GBS carrier / proteinuria with atypical pre-eclampsia  Prenatal Labs: ABO, Rh: --/--/AB POS (05/11 1815)  Antibody: NEG (05/11 1815) Rubella: Immune (11/03 0000)   RPR: Non Reactive (05/11 1630)  HBsAg: Negative (11/03 0000)  HIV: Non-reactive (11/03 0000)  GTT : POSITIVE GBS: Positive (11/03 0000)   Medical / Surgical History :  Past medical history:  Past Medical History  Diagnosis Date  . Breast cyst   . Proteinuria affecting pregnancy in third trimester, antepartum 07/21/2014    Past surgical history:  Past Surgical History  Procedure Laterality Date  . Cyst excision Right     breast  . Breast cyst excision    . Cesarean section N/A 07/22/2014    Procedure: CESAREAN SECTION;  Surgeon: Maxie BetterSheronette Hibba Schram, MD;  Location: [facility] ORS;  Service: Obstetrics;  Laterality: N/A;    Family History:  Family History  Problem Relation Age of Onset  . Cancer Mother     Social History:  reports that she has never smoked. She does not have any smokeless tobacco history on file. She reports that she does not drink alcohol or use illicit drugs.  Allergies: Penicillins; Sulfa antibiotics; Clindamycin/lincomycin; and Peanuts   Current Medications at time of admission:  Prior to Admission medications   Medication Sig Start Date End Date Taking? Authorizing Provider  calcium carbonate  (TUMS - DOSED IN MG ELEMENTAL CALCIUM) 500 MG chewable tablet Chew 2 tablets by mouth as needed for indigestion or heartburn. Patient take 4-5 times daily.   Yes Historical Provider, MD  Prenatal Vit-Fe Fumarate-FA (PRENATAL MULTIVITAMIN) TABS tablet Take 1 tablet by mouth daily at 12 noon.   Yes Historical Provider, MD   Intrapartum Course:  Admit for induction of labor with labor progression to 6cm dilation with protracted labor curve Magnesium sulfate prophylaxis intrapartum GBS treatment with vancomycin due to patient allergies Inadequate ctx - fetal intolerance evidenced by repetitive decelerations in FHR with attempts to augment with pitocin Pain management: epidural Complicated by: FHR decels - category 2 with acute episode of fetal bradycardia resulting in urgent cesarean delivery  Procedures: Cesarean section delivery on 5/12/2016with delivery of female newborn by Dr Cherly Hensenousins   See operative report for further details APGAR (1 MIN): 8   APGAR (5 MINS): 9    Postoperative / postpartum course:  discharge on POD 3  PEC stable postpartum after prophylaxis post-op with magnesium sulfate BP stable without agent control Dependent edema 1-2+ at time of DC Mild ABL anemia - hemodynamically stable / treated with iron and magnesium   Discharge Instructions:  Discharged Condition: stable  Activity: pelvic rest and postoperative restrictions x 2   Diet: routine  Medications:    Medication List    STOP taking these medications        calcium carbonate 500 MG chewable tablet  Commonly known as:  TUMS - dosed in mg elemental calcium      TAKE these medications  ferrous sulfate 325 (65 FE) MG tablet  Take 1 tablet (325 mg total) by mouth daily.     ibuprofen 600 MG tablet  Commonly known as:  ADVIL,MOTRIN  Take 1 tablet (600 mg total) by mouth every 6 (six) hours.     magnesium oxide 400 (241.3 MG) MG tablet  Commonly known as:  MAG-OX  Take 1 tablet (400 mg total)  by mouth daily.     oxyCODONE-acetaminophen 5-325 MG per tablet  Commonly known as:  PERCOCET/ROXICET  Take 1 tablet by mouth every 4 (four) hours as needed (for pain scale 4-7).     prenatal multivitamin Tabs tablet  Take 1 tablet by mouth daily at 12 noon.        Wound Care: keep clean and dry / do NOT remove honeycomb dressing Postpartum Instructions: [facility] discharge booklet - instructions reviewed  Discharge to: Home  Follow up :  [facility] in 2-4 days for interval visit with nurse for dressing & staple removal with BP recheck [facility] in 6 weeks for routine postpartum visit with Dr Cherly Hensenousins                Signed: Marlinda MikeBAILEY, TANYA CNM, MSN, [facility]ACNM 07/25/2014, 10:27 AM

## 2016-04-15 ENCOUNTER — Encounter (HOSPITAL_COMMUNITY): Payer: Self-pay | Admitting: Emergency Medicine

## 2016-04-15 ENCOUNTER — Ambulatory Visit (HOSPITAL_COMMUNITY)
Admission: EM | Admit: 2016-04-15 | Discharge: 2016-04-15 | Disposition: A | Discharge status: Home / Self Care | Payer: BC Managed Care – PPO | Attending: Family Medicine | Admitting: Family Medicine

## 2016-04-15 DIAGNOSIS — R69 Illness, unspecified: Secondary | ICD-10-CM | POA: Diagnosis not present

## 2016-04-15 DIAGNOSIS — R059 Cough, unspecified: Secondary | ICD-10-CM

## 2016-04-15 DIAGNOSIS — R05 Cough: Secondary | ICD-10-CM | POA: Diagnosis not present

## 2016-04-15 DIAGNOSIS — J111 Influenza due to unidentified influenza virus with other respiratory manifestations: Secondary | ICD-10-CM

## 2016-04-15 MED ORDER — ACETAMINOPHEN 325 MG PO TABS
650.0000 mg | ORAL_TABLET | Freq: Once | ORAL | Status: AC
  Administered 2016-04-15: 650 mg via ORAL

## 2016-04-15 MED ORDER — ACETAMINOPHEN 325 MG PO TABS
ORAL_TABLET | ORAL | Status: AC
  Filled 2016-04-15: qty 2

## 2016-04-15 NOTE — ED Triage Notes (Signed)
[facility] with a complaint of a cough, fever, chills and back pain that started yesterday.

## 2016-04-15 NOTE — Discharge Instructions (Signed)
You most likely have the flu and as we discussed it is symptomatic treatment. Would recommend using Motrin 800mg  every 8  hours, with Delsym for cough and rest is important. If you are remaining ill after 5-7 days then suggest f/u to further evaluate. Feel better.

## 2016-04-15 NOTE — ED Provider Notes (Signed)
CSN: [id number]     Arrival date & time 04/15/16  1337 History   First MD Initiated Contact with Patient 04/15/16 1446     Chief Complaint  Patient presents with  . Cough   (Consider location/radiation/quality/duration/timing/severity/associated sxs/prior Treatment) Patient is a 31 yo otherwise healthy female who presents with a <24 hour history of fevers, dry cough, malaise and headaches.       Past Medical History:  Diagnosis Date  . Breast cyst   . Proteinuria affecting pregnancy in third trimester, antepartum 07/21/2014   Past Surgical History:  Procedure Laterality Date  . BREAST CYST EXCISION    . CESAREAN SECTION N/A 07/22/2014   Procedure: CESAREAN SECTION;  Surgeon: Maxie BetterSheronette Cousins, MD;  Location: [facility];  Service: Obstetrics;  Laterality: N/A;  . CYST EXCISION Right    breast   Family History  Problem Relation Age of Onset  . Cancer Mother    Social History  Substance Use Topics  . Smoking status: Never Smoker  . Smokeless tobacco: Not on file  . Alcohol use No   OB History    Gravida Para Term Preterm AB Living   1 1 1     1    SAB TAB Ectopic Multiple Live Births           1     Review of Systems  Constitutional: Positive for chills, fatigue and fever.  HENT: Negative.   Respiratory: Positive for cough. Negative for shortness of breath, wheezing and stridor.   Allergic/Immunologic: Negative.   Psychiatric/Behavioral: Negative.     Allergies  Penicillins; Sulfa antibiotics; Clindamycin/lincomycin; and Peanuts [peanut oil]  Home Medications   Prior to Admission medications   Medication Sig Start Date End Date Taking? Authorizing Provider  Pseudoephedrine-APAP-DM (TYLENOL COLD/FLU SEVERE DAY PO) Take by mouth.   Yes Historical Provider, MD   Meds Ordered and Administered this Visit   Medications  acetaminophen (TYLENOL) tablet 650 mg (650 mg Oral Given 04/15/16 1414)    BP 111/58 (BP Location: Right Arm)   Pulse 91   Temp 102.2 F (39 C)  (Oral)   Resp 18   SpO2 100%  No data found.   Physical Exam  Constitutional: She appears well-developed and well-nourished. No distress.  HENT:  Right Ear: External ear normal.  Left Ear: External ear normal.  Mouth/Throat: Oropharynx is clear and moist.  Neck: Normal range of motion.  Cardiovascular: Normal rate and regular rhythm.   Pulmonary/Chest: Effort normal. She has wheezes.  Lymphadenopathy:    She has no cervical adenopathy.  Skin: Skin is warm and dry. She is not diaphoretic.  Psychiatric: Her behavior is normal.  Nursing note and vitals reviewed.   Urgent Care Course     Procedures (including critical care time)  Labs Review Labs Reviewed - No data to display  Imaging Review No results found.   Visual Acuity Review  Right Eye Distance:   Left Eye Distance:   Bilateral Distance:    Right Eye Near:   Left Eye Near:    Bilateral Near:         MDM   1. Influenza-like illness   2. Cough    Probable flu given symptoms. We discussed use of Tamiflu in an otherwise healthy patient and elected to just treat symptomatically at this time with use of OTC remedies.  F/U here if worsens.     Riki SheerMichelle G Sage Hammill, PA-C 04/15/16 [phone]

## 2017-02-06 ENCOUNTER — Other Ambulatory Visit: Payer: Self-pay | Admitting: Family Medicine

## 2017-02-06 DIAGNOSIS — E049 Nontoxic goiter, unspecified: Secondary | ICD-10-CM

## 2017-05-08 ENCOUNTER — Other Ambulatory Visit: Payer: Self-pay | Admitting: Obstetrics and Gynecology

## 2017-05-08 ENCOUNTER — Other Ambulatory Visit (HOSPITAL_COMMUNITY)
Admission: RE | Admit: 2017-05-08 | Discharge: 2017-05-08 | Disposition: A | Discharge status: Home / Self Care | Payer: BC Managed Care – PPO | Source: Ambulatory Visit | Attending: Obstetrics and Gynecology | Admitting: Obstetrics and Gynecology

## 2017-05-08 DIAGNOSIS — Z124 Encounter for screening for malignant neoplasm of cervix: Secondary | ICD-10-CM | POA: Insufficient documentation

## 2017-05-10 LAB — CYTOLOGY - PAP: Diagnosis: NEGATIVE

## 2019-08-21 ENCOUNTER — Other Ambulatory Visit: Payer: Self-pay | Admitting: Obstetrics and Gynecology

## 2019-08-21 DIAGNOSIS — N63 Unspecified lump in unspecified breast: Secondary | ICD-10-CM

## 2019-09-08 ENCOUNTER — Other Ambulatory Visit: Payer: Self-pay

## 2019-09-08 ENCOUNTER — Ambulatory Visit
Admission: RE | Admit: 2019-09-08 | Discharge: 2019-09-08 | Disposition: A | Discharge status: Home / Self Care | Payer: BC Managed Care – PPO | Source: Ambulatory Visit | Attending: Obstetrics and Gynecology | Admitting: Obstetrics and Gynecology

## 2019-09-08 DIAGNOSIS — N63 Unspecified lump in unspecified breast: Secondary | ICD-10-CM

## 2020-02-26 ENCOUNTER — Emergency Department (HOSPITAL_COMMUNITY): Payer: BC Managed Care – PPO

## 2020-02-26 ENCOUNTER — Other Ambulatory Visit: Payer: Self-pay

## 2020-02-26 ENCOUNTER — Encounter (HOSPITAL_COMMUNITY): Payer: Self-pay

## 2020-02-26 ENCOUNTER — Emergency Department (HOSPITAL_COMMUNITY)
Admission: EM | Admit: 2020-02-26 | Discharge: 2020-02-26 | Disposition: A | Discharge status: Home / Self Care | Payer: BC Managed Care – PPO | Attending: Emergency Medicine | Admitting: Emergency Medicine

## 2020-02-26 DIAGNOSIS — M546 Pain in thoracic spine: Secondary | ICD-10-CM | POA: Diagnosis not present

## 2020-02-26 DIAGNOSIS — Z91010 Allergy to peanuts: Secondary | ICD-10-CM | POA: Insufficient documentation

## 2020-02-26 DIAGNOSIS — Y92410 Unspecified street and highway as the place of occurrence of the external cause: Secondary | ICD-10-CM | POA: Diagnosis not present

## 2020-02-26 DIAGNOSIS — M545 Low back pain, unspecified: Secondary | ICD-10-CM | POA: Insufficient documentation

## 2020-02-26 LAB — POC URINE PREG, ED: Preg Test, Ur: NEGATIVE

## 2020-02-26 MED ORDER — NAPROXEN 500 MG PO TABS: 500.0000 mg | ORAL_TABLET | Freq: Two times a day (BID) | ORAL | 0 refills | Status: AC | PRN

## 2020-02-26 MED ORDER — METHOCARBAMOL 500 MG PO TABS: 500.0000 mg | ORAL_TABLET | Freq: Three times a day (TID) | ORAL | 0 refills | Status: AC | PRN
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
## 2020-03-16 ENCOUNTER — Other Ambulatory Visit: Payer: Self-pay | Admitting: Chiropractic Medicine

## 2020-03-16 DIAGNOSIS — M545 Low back pain, unspecified: Secondary | ICD-10-CM

## 2020-04-10 ENCOUNTER — Ambulatory Visit
Admission: RE | Admit: 2020-04-10 | Discharge: 2020-04-10 | Disposition: A | Discharge status: Home / Self Care | Payer: PRIVATE HEALTH INSURANCE | Source: Ambulatory Visit | Attending: Chiropractic Medicine | Admitting: Chiropractic Medicine

## 2020-04-10 DIAGNOSIS — M545 Low back pain, unspecified: Secondary | ICD-10-CM

## 2020-12-07 ENCOUNTER — Other Ambulatory Visit: Payer: Self-pay

## 2020-12-07 ENCOUNTER — Encounter (HOSPITAL_COMMUNITY): Payer: Self-pay

## 2020-12-07 ENCOUNTER — Emergency Department (HOSPITAL_COMMUNITY)
Admission: EM | Admit: 2020-12-07 | Discharge: 2020-12-07 | Disposition: A | Discharge status: Home / Self Care | Payer: BC Managed Care – PPO | Attending: Emergency Medicine | Admitting: Emergency Medicine

## 2020-12-07 DIAGNOSIS — K611 Rectal abscess: Secondary | ICD-10-CM | POA: Insufficient documentation

## 2020-12-07 DIAGNOSIS — R1909 Other intra-abdominal and pelvic swelling, mass and lump: Secondary | ICD-10-CM | POA: Diagnosis present

## 2020-12-07 DIAGNOSIS — Z91010 Allergy to peanuts: Secondary | ICD-10-CM | POA: Insufficient documentation

## 2020-12-07 LAB — BASIC METABOLIC PANEL
Anion gap: 8 (ref 5–15)
BUN: 15 mg/dL (ref 6–20)
CO2: 25 mmol/L (ref 22–32)
Calcium: 9 mg/dL (ref 8.9–10.3)
Chloride: 103 mmol/L (ref 98–111)
Creatinine, Ser: 0.84 mg/dL (ref 0.44–1.00)
GFR, Estimated: >60 mL/min (ref >60)
Glucose, Bld: 95 mg/dL (ref 70–99)
Potassium: 3.8 mmol/L (ref 3.5–5.1)
Sodium: 136 mmol/L (ref 135–145)

## 2020-12-07 LAB — CBC WITH DIFFERENTIAL/PLATELET
Abs Immature Granulocytes: 0.04 10*3/uL (ref 0.00–0.07)
Basophils Absolute: 0.1 10*3/uL (ref 0.0–0.1)
Basophils Relative: 0 %
Eosinophils Absolute: 0.2 10*3/uL (ref 0.0–0.5)
Eosinophils Relative: 2 %
HCT: 38.6 % (ref 36.0–46.0)
Hemoglobin: 12.7 g/dL (ref 12.0–15.0)
Immature Granulocytes: 0 %
Lymphocytes Relative: 15 %
Lymphs Abs: 1.8 10*3/uL (ref 0.7–4.0)
MCH: 32.2 pg (ref 26.0–34.0)
MCHC: 32.9 g/dL (ref 30.0–36.0)
MCV: 97.7 fL (ref 80.0–100.0)
Monocytes Absolute: 1.3 10*3/uL — ABNORMAL HIGH (ref 0.1–1.0)
Monocytes Relative: 10 %
Neutro Abs: 9.1 10*3/uL — ABNORMAL HIGH (ref 1.7–7.7)
Neutrophils Relative %: 73 %
Platelets: 337 10*3/uL (ref 150–400)
RBC: 3.95 MIL/uL (ref 3.87–5.11)
RDW: 12.8 % (ref 11.5–15.5)
WBC: 12.6 10*3/uL — ABNORMAL HIGH (ref 4.0–10.5)
nRBC: 0 % (ref 0.0–0.2)

## 2020-12-07 MED ORDER — FLUCONAZOLE 200 MG PO TABS: 200.0000 mg | ORAL_TABLET | Freq: Every day | ORAL | 0 refills | Status: AC

## 2020-12-07 MED ORDER — HYDROCODONE-ACETAMINOPHEN 5-325 MG PO TABS: 1.0000 | ORAL_TABLET | Freq: Four times a day (QID) | ORAL | 0 refills | Status: AC | PRN

## 2020-12-07 MED ORDER — LIDOCAINE-EPINEPHRINE (PF) 2 %-1:200000 IJ SOLN
10.0000 mL | Freq: Once | INTRAMUSCULAR | Status: AC
  Administered 2020-12-07: 10 mL
  Filled 2020-12-07: qty 20

## 2020-12-07 MED ORDER — CIPROFLOXACIN HCL 500 MG PO TABS: 500.0000 mg | ORAL_TABLET | Freq: Two times a day (BID) | ORAL | 0 refills | Status: AC

## 2020-12-07 MED ORDER — METRONIDAZOLE 500 MG PO TABS: 500.0000 mg | ORAL_TABLET | Freq: Two times a day (BID) | ORAL | 0 refills | Status: AC
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
## 2020-12-07 NOTE — ED Notes (Signed)
Pt expressed discomfort with sitting position.

## 2020-12-07 NOTE — ED Notes (Signed)
Pt ambulatory in ED lobby. 

## 2020-12-07 NOTE — Discharge Instructions (Addendum)
Continue warm soaks and heating pad.  Start the antibiotic and you were given a prescription for yeast medication as needed.

## 2020-12-07 NOTE — ED Triage Notes (Signed)
Pt presents with c/o perirectal abscess. Pt reports it has been present for 2 weeks. Pt has been trying home remedies/soaks with no relief. Pt went to UC today and was sent here.

## 2020-12-07 NOTE — ED Provider Notes (Signed)
Emergency Medicine Provider Triage Evaluation Note  Sherri Lewis , a 35 y.o. female  was evaluated in triage.  Pt complains of perirectal abscess.  Review of Systems  Positive: Perirectal abscess Negative: vomiting  Physical Exam  BP 128/82 (BP Location: Left Arm)   Pulse 66   Temp 98.9 F (37.2 C) (Oral)   Resp 17   LMP 11/16/2020 (Approximate)   SpO2 100%  Gen:   Awake, no distress   Resp:  Normal effort  MSK:   Moves extremities without difficulty  Other:    Medical Decision Making  Medically screening exam initiated at 4:19 PM.  Appropriate orders placed.  Sherri Lewis was informed that the remainder of the evaluation will be completed by another provider, this initial triage assessment does not replace that evaluation, and the importance of remaining in the ED until their evaluation is complete.     Sherri Lewis 12/07/20 1619    Sherri Cockayne, MD 12/14/20 [phone]
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
